# Patient Record
Sex: Female | Born: 1970 | Race: Black or African American | Hispanic: Yes | Marital: Married | Smoking: Never smoker
Health system: Northeastern US, Academic
[De-identification: ages and names within clinical notes are randomized; demographics above are authoritative.]

## PROBLEM LIST (undated history)

## (undated) ENCOUNTER — Encounter

## (undated) MED FILL — IBUPROFEN 400 MG TABLET: 400 400 mg | ORAL | 10 days supply | Qty: 40 | Fill #0 | Status: CN

---

## 2012-06-29 ENCOUNTER — Emergency Department: Admit: 2012-06-29 | Disposition: A | Discharge status: Home / Self Care | Payer: Commercial Managed Care - PPO

## 2012-06-29 DIAGNOSIS — 611  : Principal | ICD-9-CM

## 2012-07-01 ENCOUNTER — Emergency Department: Admit: 2012-07-01 | Disposition: A | Discharge status: Home / Self Care | Payer: Commercial Managed Care - PPO

## 2012-07-01 DIAGNOSIS — 611  : Principal | ICD-9-CM

## 2015-06-29 ENCOUNTER — Ambulatory Visit

## 2015-06-29 NOTE — Progress Notes (Signed)
[facility] June 29, 2015  [address]  [place] [address]  Main: [phone]  Fax:   Patient Portal: [email or web address]            [name]  [address]  [place], [place]  [address]      Dear  [name],     I am writing this letter to inform you that Dr. Jodi Mourning will be shifting away from primary care at [facility].  Please accept our apologies for any inconvenience this may pose.  We will work diligently to ensure that your primary care at [facility] transitions smoothly to one of our many excellent physicians.   In order to ensure your health care needs are met without disruption, we have selected [New MD] to serve as your new Primary Care Physician. If you prefer, we would be happy to discuss other options for a different primary care doctor.   Please contact our office at [phone] to arrange an appointment to meet your new physician. [facility] has extended office hours and is open until 8 pm on Monday-Thursday and 8am-12pm on Saturdays.    If you have any type of HMO insurance, please be sure to let them know that you have a new primary care physician so that there will be no disruption in your coverage.   Please note that this letter was originally created on April 15th. If you have called our office and chosen another Primary Care Physician since this time, please discard this letter.   If you have signed up for our Primary Care portal, please select your new provider on the drop-down list on our website: [email or web address].   Thank you for continuing your care at [facility]. Please feel free to contact me if you have any questions or concerns.  We look forward to providing you with the best primary care.         Sincerely,       Leron Croak, MD, MPH  Chief, Internal Medicine and Adult Primary Care  [facility]           Created By Ann Held  on 06/29/2015 at 02:04 PM    Electronically Signed By Ann Held on 06/29/2015 at 02:04 PM

## 2015-11-15 ENCOUNTER — Ambulatory Visit (HOSPITAL_BASED_OUTPATIENT_CLINIC_OR_DEPARTMENT_OTHER)

## 2015-11-15 ENCOUNTER — Ambulatory Visit

## 2015-11-15 ENCOUNTER — Ambulatory Visit: Admitting: Ophthalmology

## 2015-11-29 ENCOUNTER — Ambulatory Visit

## 2015-11-29 LAB — HX CHOLESTEROL
HX CHOLESTEROL: 167 mg/dL (ref 110–199)
HX HIGH DENSITY LIPOPROTEIN CHOL (HDL): 50 mg/dL (ref 35–75)
HX LDL: 106 mg/dL (ref 0–129)
HX TRIGLYCERIDES: 55 mg/dL (ref 40–250)

## 2015-11-29 LAB — HX CHEM-LIPIDS
HX CHOL-HDL RATIO: 3.3
HX CHOLESTEROL: 167 mg/dL (ref 110–199)
HX HIGH DENSITY LIPOPROTEIN CHOL (HDL): 50 mg/dL (ref 35–75)
HX HOURS FAST: 8 h
HX LDL: 106 mg/dL (ref 0–129)
HX TRIGLYCERIDES: 55 mg/dL (ref 40–250)

## 2015-11-29 LAB — HX CHEM-METABOLIC: HX HEMOGLOBIN A1C: 5.4 % (ref 4.3–5.8)

## 2015-11-29 LAB — HX DIABETES: HX HEMOGLOBIN A1C: 5.4 % (ref 4.3–5.8)

## 2015-11-29 NOTE — Progress Notes (Signed)
General Medicine Visit  .  Service Due by Standard Protocol Rules:   .  Initial Screening   * [place]: Draganitchki (Zahary)  Ht: 63.25 in.  Wt: 186.6 lbs.   BMI: 32.91  with shoes  Temp: 98.3 deg F.     BP (Initial [place] Screening): 126 / 82      BP (Rechecked, Actionable): 126 /   82 mmHg   HR: 65     Travel outside of the [place] in past 28 days:: No  Chronic Pain Assessment: Does pt experience chronic pain ? NO  Smoking Assessment: Tobacco use? never smoker  .  Self Management materials provided? Printed handout: Keeping You Healthy  .  Med List: PRINTED by [place] for patient   Med List: PRINTED by [place] for patient   Patient consents for eRx History:  Paper  .  Falls Risk Assessment:   In the past year, have you ... Had no falls  Difficulty with balance? NO  Need assistance with ambulation while here? NO  Behavioral Health Tools:   PHQ2 Screening Score 0  .  Comments: ......................................Marland KitchenZahary Draganitchki  Septe  mber 19, 2017 10:56 AM  .  .  .  Basic Visit  Chief Complaint:   knee and chest pain  .  History of Present Illness:   45 yo woman here with knee pain/swelling and chest pain  was a patient of Dr Princess Perna, accompanied by her daughter  .  knee pain comes and goes, has been going on for two years  points to back of knee where pain is   sometimes feels like knee is folding and gives out on her  ONLY left knee  going down stairs hurts more than going up. walking for a long distance hur  ts as well  .  chest pain: not every day, but feels here and there, has been going on for   about a month   points to over left breast, sometimes travels over back   gets better when she sits down   usually lasts about 30 min in total  doing anything or bending down tends to make it worse  no other symptoms, no trouble breathing, lighteadedness or dizziness  .  .  .  .  .  .  .  .  .  .  Marland Kitchen  Past Medical History (prior to today's visit):  BENIGN POSITIONAL VERTIGO (ICD-386.11) (ICD10-H81.10)  ALLERGIC CONJUNCTIVITIS  (ICD-372.14) (ICD10-H10.10)  ADULT HEALTHY PHYSICAL (ICD-V70.0) (ICD10-Z00.00)  ELEVATED BLOOD PRESSURE WITHOUT DIAGNOSIS OF HYPERTENSION (ICD-796.2) (ICD1  0-R03.0)  VARICOSE VEINS (ICD-454.9) (ICD10-I83.90)  OTHER SCREENING MAMMOGRAM (ICD-V76.12) (ICD10-Z12.39)  KNEE PAIN, LEFT, ACUTE (ICD-719.46) (ICD10-M25.562)  NEVI, MULTIPLE (ICD-216.9) (ICD10-D23.9)  ROUTINE GYNECOLOGICAL EXAMINATION (ICD-V72.31) (ICD10-Z01.419)  ROUTINE GENERAL MEDICAL EXAM@HEALTH  CARE FACL (ICD-V70.0) (ICD10-Z00.00)  VITAMIN D DEFICIENCY (ICD-268.9) (ICD10-E55.9)  PTERYGIUM (ICD-372.40) (ICD10-H11.009)  SCREENING - LIPID (ICD-V77.91) (ICD10-Z13.220)  Hx of HERNIA, UMBILICAL (ICD-553.1) (ZOX09-U04.9)  Hx of OVARIAN CYST (ICD-620.2) (ICD10-N83.20)  Hx of PLANTAR WART, RIGHT (ICD-078.12) (ICD10-B07.0)  Hx of WART, LEFT HAND (ICD-078.10) (ICD10-B07.8)  ABN PAP ASCUS PAP (ICD-795.01) (ICD10-R87.610)  .  Past Medical History (changes today):  Added new problem of KNEE PAIN, LEFT (ICD-719.46) (ICD10-M25.562)  Added new problem of CHEST DISCOMFORT, ATYPICAL (ICD-786.59) (ICD10-R07.89)  Removed problem of ADULT HEALTHY PHYSICAL (ICD-V70.0) (ICD10-Z00.00)  Removed problem of ELEVATED BLOOD PRESSURE WITHOUT DIAGNOSIS OF HYPERTENSIO  N (ICD-796.2) (ICD10-R03.0)  Removed problem of OTHER SCREENING MAMMOGRAM (ICD-V76.12) (ICD10-Z12.39)  Removed problem of KNEE PAIN, LEFT, ACUTE (  BJY-782.95) (ICD10-M25.562)  Removed problem of ROUTINE GYNECOLOGICAL EXAMINATION (ICD-V72.31) (ICD10-Z0  1.419)  Removed problem of ROUTINE GENERAL MEDICAL EXAM@HEALTH  CARE FACL (ICD-V70.0  ) (ICD10-Z00.00)  Removed problem of SCREENING - LIPID (ICD-V77.91) (ICD10-Z13.220)  Removed problem of History of  WART, LEFT HAND (ICD-078.10) (ICD10-B07.8)  Removed problem of History of  PLANTAR WART, RIGHT (ICD-078.12) (ICD10-B07.  0)  .  Medications (prior to today's visit):  ALAWAY 0.025 % SOLN (KETOTIFEN FUMARATE) use twoce ad ay on bot h eyes for   1 month  ERGOCALCIFEROL 50000 UNIT  CAPS (ERGOCALCIFEROL) 1 tablet weekly  MECLIZINE HCL 25 MG CHEW (MECLIZINE HCL) 1 tablet po tid for 1 week  .  Medications (after today's visit):  ALAWAY 0.025 % SOLN (KETOTIFEN FUMARATE) use twoce ad ay on bot h eyes for   1 month  .  Marland Kitchen  No Known Allergies  .  .  .  Vitals:   Ht: 63.25 in.  Wt: 186.6 lbs.  BMI (in-lb) 32.91  Temp: 98.3deg F.     BP (Initial [place] Screening): 126 / 82     BP (Rechecked, Actionable): 126 / 8  2 mmHg   Pulse Rate: 65 bpm  .  .  Additional PE:   gen: no acute distress   cv: rrr. chest pain is reproducible with palpation over left edge of sternum  pulm: resp effort wnl, lungs ctab   msk: no erythema or swelling over either knee. no pain with varus or valgus   stress   neuro: a /T/ o x 4  .  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  Chest wall discomfort: suspect costochondritis. Check EKG today. Try tyleno  l or ibuprofen as needed, Warm or cold packs as needed. Let me know if wors  ening before I see you in October instead of getting better  .  Check labs today in preparation for annual exam    .  .  Orders (this visit):  [facility] [Ref-PT]  EKG - Heart Station [CPT-93000]  Hemoglobin A1C (Glycohemoglobin) [CPT-83036]  Lipid Profile-Chol, HDL, Trig, LDL(calc) [CPT-80061]  Vit D 25 Hydroxy [CPT-82652]  Est Level 4 (1121/NP2292) [AOZ-30865]  .  Patient Care Plan  .  .  .  .  Vaccine Info Sheets   .  Marland Kitchen  Electronically Signed by Ailene Ards, MD on 12/05/2015 at 2:48 PM  ________________________________________________________________________

## 2015-11-29 NOTE — Progress Notes (Signed)
[facility] November 29, 2015  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 Check EKG today. Try tylenol or ibuprofen as needed, Warm or cold packs as needed. Let me know if worsening before I see you in October instead of getting better    Check labs today in preparation for annual exam     Labs, Tests and Referrals ordered:  Hemoglobin A1C (Glycohemoglobin) [CPT-83036]  Lipid Profile-Chol, HDL, Trig, LDL(calc) [CPT-80061]  Vit D 25 Hydroxy [CPT-82652]  [facility] [Ref-PT]  Medications added or changed today include:      Medications removed today include:  ERGOCALCIFEROL 50000 UNIT CAPS (ERGOCALCIFEROL) 1 tablet weekly  MECLIZINE HCL 25 MG CHEW (MECLIZINE HCL) 1 tablet po tid for 1 week    For a complete listing of all of your current medications, please see the last page of this Clinical Visit Summary.    Problems associated with your visit include:  Added new problem of KNEE PAIN, LEFT (ICD-719.46) (ICD10-M25.562)  Added new problem of CHEST DISCOMFORT, ATYPICAL (ICD-786.59) (ICD10-R07.89)    Problem list on 11/29/2015:  CHEST DISCOMFORT, ATYPICAL (ICD-786.59) (ICD10-R07.89)  KNEE PAIN, LEFT (ICD-719.46) (ICD10-M25.562)  BENIGN POSITIONAL VERTIGO (ICD-386.11) (ICD10-H81.10)  ALLERGIC CONJUNCTIVITIS (ICD-372.14) (ICD10-H10.10)  ADULT HEALTHY  PHYSICAL (ICD-V70.0) (ICD10-Z00.00)  ELEVATED BLOOD PRESSURE WITHOUT DIAGNOSIS OF HYPERTENSION (ICD-796.2) (ICD10-R03.0)  VARICOSE VEINS (ICD-454.9) (ICD10-I83.90)  OTHER SCREENING MAMMOGRAM (ICD-V76.12) (ICD10-Z12.39)  KNEE PAIN, LEFT, ACUTE (ICD-719.46) (ICD10-M25.562)  NEVI, MULTIPLE (ICD-216.9) (ICD10-D23.9)  ROUTINE GYNECOLOGICAL EXAMINATION (ICD-V72.31) (ICD10-Z01.419)  ROUTINE GENERAL MEDICAL EXAM@HEALTH  CARE FACL (ICD-V70.0) (ICD10-Z00.00)  VITAMIN D DEFICIENCY (ICD-268.9) (ICD10-E55.9)  PTERYGIUM (ICD-372.40) (ICD10-H11.009)  SCREENING - LIPID (ICD-V77.91) (ICD10-Z13.220)  Hx of HERNIA, UMBILICAL (ICD-553.1) (FIE33-I95.9)  Hx of OVARIAN CYST (ICD-620.2) (ICD10-N83.20)  Hx of PLANTAR WART, RIGHT (ICD-078.12) (ICD10-B07.0)  Hx of WART, LEFT HAND (ICD-078.10) (ICD10-B07.8)  ABN PAP ASCUS PAP (ICD-795.01) (ICD10-R87.610)    Most recent vital signs observed:  Height: 63.25 inches   Weight: 186.6 pounds   *BMI: 32.91  Temperature: 98.3 degrees F   Pulse Rate: 65 beats/minute   Blood Pressure: 126 / 82 mmHg     *BMI (Body Mass Index) is a measure of healthy weight based on height.  For more information go to [email or web address]  Categories:   Underweight = <18.5   Normal weight = 18.5-24.9   Overweight = 25-29.9           Obesity = BMI of 30 or greater    Most recent blood tests ordered by your Primary Care team as of 11/29/2015:  Cholesterol Tests   Cholesterol 158 Normal = 110-199 02/15/2014   HDL (good cholesterol) 46 Normal > 40 02/15/2014   Triglyceride 44 Normal = 40-250 02/15/2014   LDL (bad cholesterol) 103 Normal < 160  Normal < 130 if you have  high blood pressure  Normal < 100 if you have heart disease or diabetes 02/15/2014   Diabetes Tests   Glucose random 84 Normal = 60-150 02/15/2014   HgbA1c 5.2 Normal = 4.3-5.8 07/11/2012   Blood Count Tests   Hematocrit 38.7 Normal = 37-47 02/15/2014   White Blood Cells 8.1 Normal = 4-11 02/15/2014   Platelets 231 Normal = 150-400 02/15/2014   Liver  Tests   ALT 11 Normal = 0-54 02/15/2014   AST 15 Normal = 10-42 02/15/2014   Kidney Tests   BUN 13 Normal = 6-24 02/15/2014   Creatinine 0.74 Normal = 0.57-1.30 02/15/2014   GFR (Non African-American) 100 Normal > 60 02/15/2014   GFR (African-American) 116 Normal > 60 02/15/2014     General Medical Advice:  If you think any of the information provided in this summary is incorrect, please notify your provider.    Always carry a list of current medications with you . Provide an updated list to your Primary care Physician or any provider who prescribes your medication.    We have a doctor on call during our off hours--if you have an urgent problem and need to speak with a doctor when our office is closed, please call [phone].    Please help [place] improve the care that we provide.    Complete our Patient Satisfaction Survey at [email or web address]     [facility] Medication List  [facility]  [name]  DOB: [birth year]   MR#: [id number]  Primary Care Doctor: Ailene Ards, MD  [phone]    Medications on 11/29/2015:  ALAWAY 0.025 % SOLN (KETOTIFEN FUMARATE) use twoce ad ay on bot h eyes for 1 month    Allergies on 11/29/2015: (if it says "NKA" below, that means "No known allergies")  NKA    Advice about Medications:  Always carry a list of current medications with you . Provide an updated list to your Primary care Physician or any provider who prescribes your medication.    Remember to keep your list updated. Include all over the counter medications such as vitamins and herbals.    Discard all old medication lists.    If you are a specialist seeing this patient and you have made changes to any of the medications above,   please mark the changes on this medication list.      Created By Ailene Ards MD on 11/29/2015 at 11:22 AM    Electronically Signed By Ailene Ards MD on 11/29/2015 at 11:22 AM

## 2015-11-29 NOTE — Progress Notes (Signed)
[facility] November 29, 2015  [address]   [place]  [place] [address]  Main: [phone]  Fax: [phone]  Patient Portal: [email or web address]              [name]  [address]  [place], [place]  [address]          MR#: [id number]          DOB: 12/24/1970    To Whom It May Concern:    I am referring [name] for PHYSICAL THERAPY:    Medical problems include:   Osteoarthritis    Diagnosis:   Knee pain:  left, patellofemoral syndrome, mild OA     Frequency: as needed     Precautions: none     Please evaluate and treat as indicated.      Sincerely,      Ailene Ards, MD  [facility]  [phone]      Created By Ailene Ards MD on 11/29/2015 at 11:19 AM    Electronically Signed By Ailene Ards MD on 11/29/2015 at 11:20 AM

## 2015-11-30 ENCOUNTER — Ambulatory Visit

## 2015-11-30 NOTE — Progress Notes (Signed)
[facility] November 30, 2015  [address]  [place] [address]  Main: [phone]  Fax:   Patient Portal: [email or web address]              Edilia Risdon  [address]  [place], [place]  [address]          MR#: [id number]          DOB: May 20, 1970    Dear  Ms. Berber,    The results of the tests performed during your visit are as follows:  Cholesterol Tests: The cholesterol looks fine and your overall cardiac risk is low.   Cholesterol 167 Normal = 110-199 11/29/2015   HDL (good cholesterol) 50 Normal > 40 11/29/2015   Triglyceride 55 Normal = 40-250 11/29/2015   LDL (bad cholesterol) 106 Normal < 190  (If you are on a cholesterol medicine, the goal is to lower this by at least 50% from your baseline) 11/29/2015   Your risk of heart attack or stroke in the next 10 years is 0.6 % If > 7.5%, then consider starting a type of medicine called "statin"     Calculate your own risk here:  http://tools.BuyDucts.dk   For more information, visit   http://tools.TransportationAnalyst.gl    Diabetes Tests: good   HgbA1c 5.4 Normal = 4.3-5.8 11/29/2015       Other Tests: The vitamin D is still low. Are you taking the once weekly dose of vitamin D? If you are not, please start taking an over the counter vitamin D supplement, 1000 units once daily.    Vitamin D 14 Normal = 20-100 11/29/2015     It was nice to meet you. Please let me know if you have any questions,     Sincerely,       Ailene Ards, MD  [facility]      ** Please be aware of our new extended hours to better care for you. Hours are: Monday through Thursday from 8 am to 8 pm; Friday from 8 am to 5 pm; Saturday from 8 am to 12 noon.        Created By Sharyn Dross on 11/30/2015 at 01:43 PM    Electronically Signed By Ailene Ards MD on 12/01/2015 at 12:55 PM

## 2015-12-01 LAB — HX CHEM-VITAMIN
HX VITAMIN D 25OH-D2: <2 ng/mL
HX VITAMIN D 25OH-D3: 14 ng/mL
HX VITAMIN D, 25 HYDROXY: 14 ng/mL — ABNORMAL LOW (ref 20–100)

## 2015-12-01 LAB — HX OSTEOPOROSIS
HX VITAMIN D 25OH-D2: <2 ng/mL
HX VITAMIN D 25OH-D3: 14 ng/mL

## 2015-12-05 ENCOUNTER — Ambulatory Visit

## 2016-01-07 ENCOUNTER — Ambulatory Visit

## 2016-01-07 NOTE — Progress Notes (Signed)
General Medicine Visit    Service Due by Standard Protocol Rules:     Initial Screening   * [place]: [name]  Ht: 63.25 in.  Wt: 190.4 lbs.   BMI: 33.58  with shoes  Temp: 97.1 deg F.     BP (InitialScreening): 112 / 65      BP (Rechecked, Actionable): 112 / 65 mmHg   HR: 66     Travel outside of the [place] in past 28 days:: No  Chronic Pain Assessment: Does pt experience chronic pain ? NO  Have you received a Flu shot recently (where)? workplace  Approximate Date: 12/16/2015  Smoking Assessment: Tobacco use? never smoker        Patient consents for eRx History:  Paper    Falls Risk Assessment:   In the past year, have you ... Had no falls  Difficulty with balance? NO  Need assistance with ambulation while here? NO  Behavioral Health Tools:   PHQ2 Screening Score 0    Comments: ......................................Marland KitchenRimsha [name]  January 07, 2016 10:33 AM       Basic Visit  Chief Complaint:   annual     History of Present Illness:   45 yo woman here for annual   CHEST DISCOMFORT, ATYPICAL: no further episodes  KNEE PAIN, LEFT: started physical therapy, pain has not changed. tends to come on with walking. ok in the morning. back tends to get swollen never gives out on her  BENIGN POSITIONAL VERTIGO: denies symptoms of this  ALLERGIC CONJUNCTIVITIS: uses eydrops prn  VARICOSE VEINS: using compression stockings (saw vascular surg Aug 2016)   VITAMIN D DEFICIENCY: taking vitamin D daily   PTERYGIUM: sees eye doctor  Hx of HERNIA, UMBILICAL  Hx of OVARIAN CYST        Past Medical History:(reviewed)  KNEE PAIN, LEFT  VITAMIN D DEFICIENCY  ALLERGIC CONJUNCTIVITIS  PTERYGIUM  Hx of HERNIA, UMBILICAL  Hx of OVARIAN CYST    HCM:  Tdap: 2011  Zoster: at age 58  Prevnar/Pneumovax: at age 53  HAV/HBV status:  Pap: Oct 2010: NIL/HPV-, May 2014: NIL/HPV-,   Mammo: May 2016: wnl, dense breasts  Colonoscopy: at age 35  DEXA: at age 58    Family History: (reviewed)   M: healthy  F: healthy  Siblings: 8, healthy, one with surgery  for valvulopathy  Children: 3, healthy   no hx of breast, colon ca in family, no hx heart disease or strokes    Social History: (reviewed)   Works: Advertising copywriter, full time  Tobacco: never   EtOH: none   IVDU: none  Exercise: walks   Diet: balanced, about 2 servings calcium/day   Seatbelts/sunscreen: Y   Feels safe at home: Y, lives with family  Sexually active: Y  Contraception: vasectomy      A complete ROS was done. All positive responses are listed in HPI section. All other systems reviewed in detail and are negative.      Past Medical History (prior to today's visit):  CHEST DISCOMFORT, ATYPICAL (ICD-786.59) (ICD10-R07.89)  KNEE PAIN, LEFT (ICD-719.46) (ICD10-M25.562)  BENIGN POSITIONAL VERTIGO (ICD-386.11) (ICD10-H81.10)  ALLERGIC CONJUNCTIVITIS (ICD-372.14) (ICD10-H10.10)  VARICOSE VEINS (ICD-454.9) (ICD10-I83.90)  NEVI, MULTIPLE (ICD-216.9) (ICD10-D23.9)  VITAMIN D DEFICIENCY (ICD-268.9) (ICD10-E55.9)  PTERYGIUM (ICD-372.40) (ICD10-H11.009)  Hx of HERNIA, UMBILICAL (ICD-553.1) (UEA54-U98.9)  Hx of OVARIAN CYST (ICD-620.2) (ICD10-N83.20)  ABN PAP ASCUS PAP (ICD-795.01) (ICD10-R87.610)    Past Medical History (changes today):  Removed problem of ABN PAP ASCUS PAP (ICD-795.01) (  ZOX09-U04.540)  Changed problem from CHEST DISCOMFORT, ATYPICAL (ICD-786.59) (ICD10-R07.89) to CHEST DISCOMFORT, ATYPICAL (ICD-786.59) (ICD10-R07.89)  Removed problem of BENIGN POSITIONAL VERTIGO (ICD-386.11) (ICD10-H81.10)  Removed problem of VARICOSE VEINS (ICD-454.9) (ICD10-I83.90)  Added new problem of ANNUAL EXAM - OCT 2017 (ICD-V70.0) (ICD10-Z00.00)  Added new problem of SCREENING MAMMOGRAM (ICD-V76.12) (JWJ19-J47.31)  Removed problem of NEVI, MULTIPLE (ICD-216.9) (ICD10-D23.9)       Medications (prior to today's visit):  ALAWAY 0.025 % SOLN (KETOTIFEN FUMARATE) use twoce ad ay on bot h eyes for 1 month    Medications (after today's visit):  ALAWAY 0.025 % SOLN (KETOTIFEN FUMARATE) use twoce ad ay on bot h eyes for 1 month  VITAMIN  D3 1000 UNIT TABS (CHOLECALCIFEROL) Take one tablet by mouth once daily  * COMPRESSION STOCKING 30-40 MMHG use as directed daily. ICD 10: I83.893, [place] PPO: [phone]         No Known Allergies        Vitals:   Ht: 63.25 in.  Wt: 190.4 lbs.  BMI (in-lb) 33.58  Temp: 97.1deg F.     BP (Initial [place] Screening): 112 / 65     BP (Rechecked, Actionable): 112 / 65 mmHg   Pulse Rate: 66 bpm      Additional PE:   Gen: Alert, NAD, well hydrated, well developed  Head: NCAT  Ears: no external deformities, canals clear, TMs pearly gray, gross hearing intact  Eyes: EOMI, PERRL, no scleral icterus, no conjunctival injection, vision grossly normal. bilateral ptyerigium  Nose: no bleeding  Mouth: Mod dentition, no lesions, tongue midline, no exudate/erythema  Neck: Supple, no ant/post cervical or supraclavicular lymphadenopathy, thyroid not palpable  CV: RRR, no M/R/G, no edema, DP 2+ b/l  Pulm: CTA b/l, resp effort wnl  Abd: soft, nontender, nondistended  Breast: no axillary LAD, breasts symmetric in seated and supine position, no nipple discharge, skin changes or dominant masses   Musc: station and gait wnl, moves all extremities without obvious limitations  Skin: No rashes, no obvious bruising, no suspicious lesions  Neuro: CN 2-12 grossly intact  Psych: Mood and affect appropriate      Assessment & Plan:   45 yo woman here for annual  KNEE PAIN, LEFT: cont PT, refer to ortho  VITAMIN D DEFICIENCY: taking vitamin D   ALLERGIC CONJUNCTIVITIS: eyedrops prn  ANNUAL EXAM:   bp: ok  bmi: overweight  dep scrn: neg   chol/diab scrn fine at last appt  schedule mammo  pap due 2019  flu already done       Orders (this visit):  [place] [facility], Ortho [Ref-Surgery]  Mammo Screening [CPT-77057]  RTC in 12 months [RTC-365]  Est Prev 40-64yo (5784/ON6295) [MWU-13244]    Patient Care Plan      Immunization Worksheet 2016           Created By Deanne Coffer on 01/07/2016 at 10:30 AM    Electronically Signed By Ailene Ards MD on 01/07/2016 at  12:59 PM

## 2016-01-07 NOTE — Progress Notes (Signed)
[facility] January 07, 2016  [address]   [place]  [place] [address]  Main: [phone]  Fax: [phone]  Patient Portal: [email or web address]            [name]  [address]  [place], [place]  [address]  [facility] Clinical Summary   [name]  DOB: 12-24-1970   MR#: [id number]  Primary Care Doctor: Ailene Ards, MD  [phone]    Summary of visit January 07, 2016 with Ailene Ards, MD:   Knee pain: continue physical therapy. Set up appointment with orthopedics    Annual:   blood pressure: good   continue vitamin D once daily  cholesterol and diabetes fine when checked   schedule mammogram  pap: due 2019    Labs, Tests and Referrals ordered:  Mammo Screening [CPT-77057]  [facility], Ortho [Ref-Surgery]  Medications added or changed today include:  VITAMIN D3 1000 UNIT TABS (CHOLECALCIFEROL) Take one tablet by mouth once daily  * COMPRESSION STOCKING 30-40 MMHG use as directed daily. ICD 10: I83.893, [place] PPO: [id number]      Medications removed today include:    For a complete listing of all of your current medications, please see the last page of this Clinical Visit Summary.    Problems associated with your visit include:  Removed problem of ABN PAP ASCUS PAP (ICD-795.01) (ICD10-R87.610)  Changed problem from CHEST DISCOMFORT, ATYPICAL (ICD-786.59) (ICD10-R07.89) to CHEST DISCOMFORT, ATYPICAL (ICD-786.59) (ICD10-R07.89)  Removed problem of BENIGN POSITIONAL VERTIGO (ICD-386.11) (ICD10-H81.10)  Removed problem of ALLERGIC CONJUNCTIVITIS (ICD-372.14) (ICD10-H10.10)  Removed problem of VARICOSE VEINS (ICD-454.9) (ICD10-I83.90)  Added new problem of ANNUAL EXAM - OCT 2017 (ICD-V70.0) (ICD10-Z00.00)  Added new problem of SCREENING MAMMOGRAM (ICD-V76.12) (SAY30-Z60.10)    Problem list on 01/07/2016:  SCREENING MAMMOGRAM (ICD-V76.12) (ICD10-Z12.31)  ANNUAL EXAM - OCT 2017 (ICD-V70.0) (ICD10-Z00.00)  CHEST DISCOMFORT, ATYPICAL  (ICD-786.59) (ICD10-R07.89)  KNEE PAIN, LEFT (ICD-719.46) (ICD10-M25.562)  NEVI, MULTIPLE (ICD-216.9) (ICD10-D23.9)  VITAMIN D DEFICIENCY (ICD-268.9) (ICD10-E55.9)  PTERYGIUM (ICD-372.40) (ICD10-H11.009)  Hx of HERNIA, UMBILICAL (ICD-553.1) (XNA35-T73.9)  Hx of OVARIAN CYST (ICD-620.2) (ICD10-N83.20)    Most recent vital signs observed:  Height: 63.25 inches   Weight: 190.4 pounds   *BMI: 33.58  Temperature: 97.1 degrees F   Pulse Rate: 66 beats/minute   Blood Pressure: 112 / 65 mmHg     *BMI (Body Mass Index) is a measure of healthy weight based on height.  For more information go to [email or web address]  Categories:   Underweight = <18.5   Normal weight = 18.5-24.9   Overweight = 25-29.9           Obesity = BMI of 30 or greater    Most recent blood tests ordered by your Primary Care team as of 01/07/2016:  Cholesterol Tests   Cholesterol 167 Normal = 110-199 11/29/2015   HDL (good cholesterol) 50 Normal > 40 11/29/2015   Triglyceride 55 Normal = 40-250 11/29/2015   LDL (bad cholesterol) 106 Normal < 160  Normal < 130 if you have high blood pressure  Normal < 100 if you have heart disease or diabetes 11/29/2015   Diabetes Tests   Glucose random 84 Normal = 60-150 02/15/2014   HgbA1c 5.4 Normal = 4.3-5.8 11/29/2015   Blood Count Tests   Hematocrit 38.7 Normal = 37-47 02/15/2014   White Blood Cells 8.1 Normal = 4-11 02/15/2014   Platelets 231 Normal = 150-400 02/15/2014   Liver Tests   ALT 11 Normal = 0-54 02/15/2014  AST 15 Normal = 10-42 02/15/2014   Kidney Tests   BUN 13 Normal = 6-24 02/15/2014   Creatinine 0.74 Normal = 0.57-1.30 02/15/2014   GFR (Non African-American) 100 Normal > 60 02/15/2014   GFR (African-American) 116 Normal > 60 02/15/2014     General Medical Advice:  If you think any of the information provided in this summary is incorrect, please notify your provider.    Always carry a list of current medications with you . Provide an updated list to your Primary care Physician or any  provider who prescribes your medication.    We have a doctor on call during our off hours--if you have an urgent problem and need to speak with a doctor when our office is closed, please call [phone].    Please help [place] improve the care that we provide.    Complete our Patient Satisfaction Survey at [email or web address]     [facility] Medication List  [facility]  [name]  DOB: [birth year]   MR#: [id number]  Primary Care Doctor: Ailene Ards, MD  [phone]    Medications on 01/07/2016:  ALAWAY 0.025 % SOLN (KETOTIFEN FUMARATE) use twoce ad ay on bot h eyes for 1 month  VITAMIN D3 1000 UNIT TABS (CHOLECALCIFEROL) Take one tablet by mouth once daily  * COMPRESSION STOCKING 30-40 MMHG use as directed daily. ICD 10: I83.893, [place] PPO: [id number]    Allergies on 01/07/2016: (if it says "NKA" below, that means "No known allergies")  NKA    Advice about Medications:  Always carry a list of current medications with you . Provide an updated list to your Primary care Physician or any provider who prescribes your medication.    Remember to keep your list updated. Include all over the counter medications such as vitamins and herbals.    Discard all old medication lists.    If you are a specialist seeing this patient and you have made changes to any of the medications above,   please mark the changes on this medication list.      Created By Ailene Ards MD on 01/07/2016 at 11:20 AM    Electronically Signed By Ailene Ards MD on 01/07/2016 at 11:20 AM

## 2016-01-07 NOTE — Progress Notes (Signed)
Checkout  Return to Clinic 12 months  Next Visit Notes: pe  Appointment Made No  Reason Appointment Not Made Patient unable to schedule at this time          Created By Mertha Finders on 01/07/2016 at 11:27 AM    Electronically Signed By Mertha Finders on 01/07/2016 at 11:28 AM

## 2016-01-07 NOTE — Progress Notes (Signed)
General Medicine Visit  .  Service Due by Standard Protocol Rules:   .  Initial Screening   * [place]: [name]  Ht: 63.25 in.  Wt: 190.4 lbs.   BMI: 33.58  with shoes  Temp: 97.1 deg F.     BP (Initial [place] Screening): 112 / 65      BP (Rechecked, Actionable): 112 /   65 mmHg   HR: 66     Travel outside of the [place] in past 28 days:: No  Chronic Pain Assessment: Does pt experience chronic pain ? NO  Have you received a Flu shot recently (where)? workplace  Approximate Date: 12/16/2015  Smoking Assessment: Tobacco use? never smoker  .  .  .  Patient consents for eRx History:  Paper  .  Falls Risk Assessment:   In the past year, have you ... Had no falls  Difficulty with balance? NO  Need assistance with ambulation while here? NO  Behavioral Health Tools:   PHQ2 Screening Score 0  .  Comments: ......................................[name] KitchenRimsha [name]  January 07, 2016 10:33 AM  .  Basic Visit  Chief Complaint:   annual   .  History of Present Illness:   45 yo woman here for annual   CHEST DISCOMFORT, ATYPICAL: no further episodes  KNEE PAIN, LEFT: started physical therapy, pain has not changed. tends to c  ome on with walking. ok in the morning. back tends to get swollen never giv  es out on her  BENIGN POSITIONAL VERTIGO: denies symptoms of this  ALLERGIC CONJUNCTIVITIS: uses eydrops prn  VARICOSE VEINS: using compression stockings (saw vascular surg Aug 2016)   VITAMIN D DEFICIENCY: taking vitamin D daily   PTERYGIUM: sees eye doctor  Hx of HERNIA, UMBILICAL  Hx of OVARIAN CYST  .  [name] [name]  [name] [name]  Past Medical History:(reviewed)  KNEE PAIN, LEFT  VITAMIN D DEFICIENCY  ALLERGIC CONJUNCTIVITIS  PTERYGIUM  Hx of HERNIA, UMBILICAL  Hx of OVARIAN CYST  .  HCM:  Tdap: 2011  Zoster: at age 38  Prevnar/Pneumovax: at age 47  HAV/HBV status:  Pap: Oct 2010: NIL/HPV-, May 2014: NIL/HPV-,   Mammo: May 2016: wnl, dense breasts  Colonoscopy: at age 69  DEXA: at age 7  .  Family History: (reviewed)   M: healthy  F: healthy  Siblings: 8,  healthy, one with surgery for valvulopathy  Children: 3, healthy   no hx of breast, colon ca in family, no hx heart disease or strokes  .  Social History: (reviewed)   Works: Advertising copywriter, full time  Tobacco: never   EtOH: none   IVDU: none  Exercise: walks   Diet: balanced, about 2 servings calcium/day   Seatbelts/sunscreen: Y   Feels safe at home: Y, lives with family  Sexually active: Y  Contraception: vasectomy  .  [name] [name]  A complete ROS was done. All positive responses are listed in HPI section.   All other systems reviewed in detail and are negative.  .  .  Past Medical History (prior to today's visit):  CHEST DISCOMFORT, ATYPICAL (ICD-786.59) (ICD10-R07.89)  KNEE PAIN, LEFT (ICD-719.46) (ICD10-M25.562)  BENIGN POSITIONAL VERTIGO (ICD-386.11) (ICD10-H81.10)  ALLERGIC CONJUNCTIVITIS (ICD-372.14) (ICD10-H10.10)  VARICOSE VEINS (ICD-454.9) (ICD10-I83.90)  NEVI, MULTIPLE (ICD-216.9) (ICD10-D23.9)  VITAMIN D DEFICIENCY (ICD-268.9) (ICD10-E55.9)  PTERYGIUM (ICD-372.40) (ICD10-H11.009)  Hx of HERNIA, UMBILICAL (ICD-553.1) (BJY78-G95.9)  Hx of OVARIAN CYST (ICD-620.2) (ICD10-N83.20)  ABN PAP ASCUS PAP (ICD-795.01) (ICD10-R87.610)  .  Past Medical History (changes today):  Removed  problem of ABN PAP ASCUS PAP (ICD-795.01) (ICD10-R87.610)  Changed problem from CHEST DISCOMFORT, ATYPICAL (ICD-786.59) (ICD10-R07.89)   to CHEST DISCOMFORT, ATYPICAL (ICD-786.59) (ICD10-R07.89)  Removed problem of BENIGN POSITIONAL VERTIGO (ICD-386.11) (ICD10-H81.10)  Removed problem of VARICOSE VEINS (ICD-454.9) (ICD10-I83.90)  Added new problem of ANNUAL EXAM - OCT 2017 (ICD-V70.0) (ICD10-Z00.00)  Added new problem of SCREENING MAMMOGRAM (ICD-V76.12) (HKV42-V95.31)  Removed problem of NEVI, MULTIPLE (ICD-216.9) (ICD10-D23.9)  .  Medications (prior to today's visit):  ALAWAY 0.025 % SOLN (KETOTIFEN FUMARATE) use twoce ad ay on bot h eyes for   1 month  .  Medications (after today's visit):  ALAWAY 0.025 % SOLN (KETOTIFEN FUMARATE) use twoce ad ay  on bot h eyes for   1 month  VITAMIN D3 1000 UNIT TABS (CHOLECALCIFEROL) Take one tablet by mouth once d  aily  * COMPRESSION STOCKING 30-40 MMHG use as directed daily. ICD 10: I83.893, [place]  [employer] PPO: [id number]  .  [name] [name]  No Known Allergies  .  .  .  Vitals:   Ht: 63.25 in.  Wt: 190.4 lbs.  BMI (in-lb) 33.58  Temp: 97.1deg F.     BP (Initial [place] Screening): 112 / 65     BP (Rechecked, Actionable): 112 / 6  5 mmHg   Pulse Rate: 66 bpm  .  .  Additional PE:   Gen: Alert, NAD, well hydrated, well developed  Head: NCAT  Ears: no external deformities, canals clear, TMs pearly gray, gross hearing   intact  Eyes: EOMI, PERRL, no scleral icterus, no conjunctival injection, vision gr  ossly normal. bilateral ptyerigium  Nose: no bleeding  Mouth: Mod dentition, no lesions, tongue midline, no exudate/erythema  Neck: Supple, no ant/post cervical or supraclavicular lymphadenopathy, thyr  oid not palpable  CV: RRR, no M/R/G, no edema, DP 2+ b/l  Pulm: CTA b/l, resp effort wnl  Abd: soft, nontender, nondistended  Breast: no axillary LAD, breasts symmetric in seated and supine position, n  o nipple discharge, skin changes or dominant masses   Musc: station and gait wnl, moves all extremities without obvious limitatio  ns  Skin: No rashes, no obvious bruising, no suspicious lesions  Neuro: CN 2-12 grossly intact  Psych: Mood and affect appropriate  .  [name] [name]  Assessment /T/ Plan:   45 yo woman here for annual  KNEE PAIN, LEFT: cont PT, refer to ortho  VITAMIN D DEFICIENCY: taking vitamin D   ALLERGIC CONJUNCTIVITIS: eyedrops prn  ANNUAL EXAM:   bp: ok  bmi: overweight  dep scrn: neg   chol/diab scrn fine at last appt  schedule mammo  pap due 2019  flu already done   .  [name] [name]  Orders (this visit):  [facility], Ortho [Ref-Surgery]  Mammo Screening [CPT-77057]  RTC in 12 months [RTC-365]  Est Prev 40-64yo ([id number]) [CPT-99396]  .  Patient Care Plan  .  [name] [name]  Immunization Worksheet 2016   .  .  [name] [name]  Electronically Signed by Ailene Ards, MD  on 01/07/2016 at 12:59 PM  ________________________________________________________________________

## 2016-01-26 ENCOUNTER — Ambulatory Visit

## 2016-01-26 ENCOUNTER — Ambulatory Visit: Admitting: Orthopaedic Surgery

## 2016-01-26 NOTE — Progress Notes (Signed)
* * *        **[name], [name]**    --- ---    45 Y old Female, DOB: 02/14/1971, External MRN: [id number]    Account Number: [id number]    [address], [place], [place]    Home: [phone]    Guarantor: [employer] Insurance: [employer] PPO Payer ID: PAPER    PCP: Grayling Congress, MD Referring: Grayling Congress, MD External  Visit ID: [id number]    [facility]ointment [facility]        * * *    01/26/2016  Progress Notes: Baron Hamper, MD **CHN#:** [id number]    --- ---    ---        Current Medications    ---    Taking     * Vitamin D     ---    Discontinued    * Compression Stockings 30-40 mmHg Thigh High - as directed ICD10: I83.893 [place] PPO: [id number] daily    ---    * Multi For Her Tablet Orally     ---    * Medication List reviewed and reconciled with the patient    ---      Past Medical History    ---       Varicose veins.        ---      Surgical History    ---      Umbilical Hernia repair    ---      Family History    ---      Mother: alive    ---    Father: alive    ---      Social History    ---    Tobacco history: Never smoked.    Alcohol  Denies.      Allergies    ---      N.K.D.A.    ---    Forrestine Him Verified]      Hospitalization/Major Diagnostic Procedure    ---      No Hospitalization History.    ---      Review of Systems    ---     _ORT_ :    Eyes No. Ear, Nose Throat No. Digestion, Stomach, Bowel No. Bladder Problems  No. Bleeding Problems No. Numbness/Tingling No. Anxiety/Depression No.  Fever/Chills/Fatigue No. Chest Pain/Tightness/Palpitations No. Skin Rash No.  Dental Problems No. Joint/Muscle Pain/Cramps Yes. Blackout/Fainting No. Other  No.            Reason for Appointment    ---      1\. LEFT KNEE PAIN    ---      History of Present Illness    ---     _GENERAL_ :    45 year old cape verdian speaking woman presents with her adult daughter  acting as an interpreter as a new patient with complaints of long standing  left knee pain. She  describes 5 years of insidious onset, waxing/waning left  knee pain. Localizes to anteriolateral and posteromedial knee however is deep.  Is worsened with activity incl stairs, walking, standing for long periods of  time, she is now unable to kneel due to the knee pain. She states it is  improved with laying down. Denies hx of trauma. Occasionally has left  hip/groin pain but not her primary complaint. + mechanical symptoms with  buckling, catching and locking described. No numbness/tingling, no fevers  chills.      Vital  Signs    ---    Pain scale 1, Ht-in 5 ft 6 in.      Physical Examination    ---    General: WD, WN obese. In NAD.    Psych: cooperative.    Neurologic: able to respond to stimuli and questions.    Eyes: sclera anicteric.    Head/Neck: NCAT.    Pulmonary: breathing comfortably. Cardiovascular: limbs all warm and well  perfused. Skin: No obvious rash. MSK:    Gait nonantalgic.    Marland Kitchen    Left Knee:    .Inspection: skin intact, no gross deformity, no effusion appreciated however  limited by habitis, no erythema    .Palpation: no boney tenderness, no crepitus, no warmth    .JIR:CVELFY 0- 90 flexion limited by pain; passive 0-120 pain limited    .Menisci: + posteromedial and lateral jointline tenderness, positive  McMurray''s for pain but no clunk, positive deep flexion, + pain with forced  full extension    .Ligaments: firm endpoint however there is increased translation with varus  stress at 0 and 30 degrees, no increased translation with valgus at 0/30; firm  endpoint no excessive translation with anterior/posterior drawer and Lachman  test    .Patella: negative grind, no quad inhibition, no excessive medial or lateral  translation, no apprehension    .Tendons: quad and patellar tendon without gap or tenderness, able to straight  leg raise, no extensor lag    .Marland Kitchen Normal Hip ROM compared to right however + pain with hip flexion IR/ER,  positive stinchfield, however states this is not the pain that causes  her to  come in.    Distal NV:    .Fires EHL,TA,GS,Peroneals    .Light touch intact DP,SP,Saph,Sural and tibial nerve distribution    .Foot/Toes warm and well perfused with brisk cap refill    .    Marland Kitchen    Marland KitchenIMAGING:Radiographs of the left knee were obtained and reviewed today. These  demonstrate no obvious fracture or dislocation. There is no degenerative  change. There is no obvious effusion. There is no gross evidence of  malalignment.      Assessments    ---    1\. Injury of meniscus of left knee, initial encounter - B01.7P1W (Primary),  probable    ---      Treatment    ---       **1\. Injury of meniscus of left knee, initial encounter**    Notes: Patient was seen and examined with Dr Everardo Beals today. She has numerous  symptoms consistent with meniscal injury and also has some likely superimposed  left hip pathology. Her hip is not her primary complaint today so we will  defer treatment and workup at this time. Given signs/symptoms suggestive of  intraarticular derangement of the left knee, we recommend obtaining MRI for  further evaluation. We discussed possible surgical intervention pending  results of the scan. We will see the patient back in clinic with the MRI  finidings.    ---      Follow Up    ---    after MRI    Electronically signed by Baron Hamper , MD on 01/27/2016 at 12:00 PM EST    Sign off status: Completed        * * *        [facility]    [address]    [place], [place] [address]    Tel: [phone]    Fax: [phone]              * * *  Patient: [name], [name] DOB: Nov 25, 1970 Progress Note: Baron Hamper, MD  01/26/2016    ---    Note generated by eClinicalWorks EMR/PM Software ([email or web address])

## 2016-01-26 NOTE — Progress Notes (Signed)
.  Progress Notes  .  Patient: [name]  Provider: Baron Hamper  DOB:1971-01-06 Age: 45 Y Sex: Female  Supervising Provider:: Baron Hamper, MD  Date: 01/26/2016  .  PCP: Grayling Congress MD  Date: 01/26/2016  .  --------------------------------------------------------------------------------  .  REASON FOR APPOINTMENT  .  1. LEFT KNEE PAIN  .  HISTORY OF PRESENT ILLNESS  .  GENERAL:   45 year old cape verdian speaking woman presents with her adult  daughter acting as an interpreter as a new patient with  complaints of long standing left knee pain. She describes 5 years  of insidious onset, waxing/waning left knee pain. Localizes to  anteriolateral and posteromedial knee however is deep. Is  worsened with activity incl stairs, walking, standing for long  periods of time, she is now unable to kneel due to the knee pain.  She states it is improved with laying down. Denies hx of trauma.  Occasionally has left hip/groin pain but not her primary  complaint. + mechanical symptoms with buckling, catching and  locking described. No numbness/tingling, no fevers chills.  .  CURRENT MEDICATIONS  .  Taking Vitamin D  Discontinued Compression Stockings 30-40 mmHg Thigh High - as  directed ICD10: I83.893 [place] PPO: [id number] daily  Discontinued Multi For Her Tablet Orally  Medication List reviewed and reconciled with the patient  .  PAST MEDICAL HISTORY  .  Varicose veins  .  ALLERGIES  .  N.K.D.A.  .  SURGICAL HISTORY  .  Umbilical Hernia repair  .  FAMILY HISTORY  .  Mother: alive  Father: alive  .  SOCIAL HISTORY  .  .  Tobaccohistory:Never smoked.  .  Alcohol Denies.  Marland Kitchen  HOSPITALIZATION/MAJOR DIAGNOSTIC PROCEDURE  .  No Hospitalization History.  Marland Kitchen  REVIEW OF SYSTEMS  .  ORT:  .  Eyes    No . Ear, Nose Throat    No . Digestion, Stomach, Bowel     No . Bladder Problems    No . Bleeding Problems    No .  Numbness/Tingling    No . Anxiety/Depression    No .  Fever/Chills/Fatigue    No . Chest  Pain/Tightness/Palpitations     No . Skin Rash    No . Dental Problems    No . Joint/Muscle  Pain/Cramps    Yes . Blackout/Fainting    No . Other    No .  .  VITAL SIGNS  .  Pain scale 1, Ht-in 5 ft 6 in.  Marland Kitchen  PHYSICAL EXAMINATION  .  General: WD, WN obese. In NAD. Psych: cooperative. Neurologic:  able to respond to stimuli and questions. Eyes: sclera anicteric.  Head/Neck: NCAT. Pulmonary: breathing comfortably.  Cardiovascular: limbs all warm and well perfused. Skin: No  obvious rash. VVO:HYWV nonantalgic.Marland KitchenLeft Knee:.Inspection: skin  intact, no gross deformity, no effusion appreciated however  limited by habitis, no erythema .Palpation: no boney tenderness,  no crepitus, no warmth.PXT:GGYIRS 0- 90 flexion limited by pain;  passive 0-120 pain limited.Menisci: + posteromedial and lateral  jointline tenderness, positive McMurray''s for pain but no clunk,  positive deep flexion, + pain with forced full  extension.Ligaments: firm endpoint however there is increased  translation with varus stress at 0 and 30 degrees, no increased  translation with valgus at 0/30; firm endpoint no excessive  translation with anterior/posterior drawer and Lachman  test.Patella: negative grind, no quad inhibition, no excessive  medial or lateral translation, no  apprehension.Tendons: quad and  patellar tendon without gap or tenderness, able to straight leg  raise, no extensor lag.. Normal Hip ROM compared to right however  + pain with hip flexion IR/ER, positive stinchfield, however  states this is not the pain that causes her to come in. Distal  NV:.Fires EHL,TA,GS,Peroneals.Light touch intact DP,SP,Saph,Sural  and tibial nerve distribution.Foot/Toes warm and well perfused  with brisk cap refill .Marland KitchenMarland KitchenIMAGING:Radiographs of the left knee  were obtained and reviewed today. These demonstrate no obvious  fracture or dislocation. There is no degenerative change. There  is no obvious effusion. There is no gross evidence  of  malalignment.  .  ASSESSMENTS  .  Injury of meniscus of left knee, initial encounter - N82.9F6O  (Primary), probable  .  TREATMENT  .  Injury of meniscus of left knee, initial encounter  Notes: Patient was seen and examined with Dr Everardo Beals today. She has  numerous symptoms consistent with meniscal injury and also has  some likely superimposed left hip pathology. Her hip is not her  primary complaint today so we will defer treatment and workup at  this time. Given signs/symptoms suggestive of intraarticular  derangement of the left knee, we recommend obtaining MRI for  further evaluation. We discussed possible surgical intervention  pending results of the scan. We will see the patient back in  clinic with the MRI finidings.  .  FOLLOW UP  .  after MRI  .  Electronically signed by Baron Hamper , MD on  01/27/2016 at 12:00 PM EST  .  CONFIRMATORY SIGN OFF  .  Marland Kitchen  Document electronically signed by Baron Hamper    .

## 2016-02-04 ENCOUNTER — Ambulatory Visit: Admitting: Orthopaedic Surgery

## 2016-02-04 ENCOUNTER — Ambulatory Visit

## 2016-02-09 ENCOUNTER — Ambulatory Visit: Admitting: Orthopaedic Surgery

## 2016-02-09 ENCOUNTER — Ambulatory Visit

## 2016-02-09 NOTE — Progress Notes (Signed)
.    Progress Notes  .  Patient: [name]  Provider: Baron Hamper  DOB:Sep 05, 1970 Age: 45 Y Sex: Female  .  PCP: Phillips Grout MD  Date: 02/09/2016  .  --------------------------------------------------------------------------------  .  REASON FOR APPOINTMENT  .  1. 2W FU MRI RESULTS LEFT KNEE  .  HISTORY OF PRESENT ILLNESS  .  GENERAL:  [name] is a 45 year-old [place] Verdian  speaking female presenting with her daughter acting as an  interpreter for her f/u for her left knee MRI results. She has  done PT without relief of her symptoms. She denies any changes to  her knee pain, trauma, numbness or tingling of her lower  extremity.  .  CURRENT MEDICATIONS  .  Taking Vitamin D  .  ALLERGIES  .  yes[Allergies Verified]  .  EXAMINATION  .  GENERAL:  GeneralWell-appearing, No acute distress.  On examination of the LLE, the skin of the left knee is intact,  without swelling, ecchymosis or deformity. Mild TTP of the  anterior lateral of knee. Neurovascular intact.  .  Radiographs: MRI of the left knee:-ACL  normal-Menisci normal-Arthritis of anterior medial ridge of the  patella knee joint.  .  ASSESSMENTS  .  Injury of meniscus of left knee, initial encounter - Z61.0R6E  (Primary), probable  .  TREATMENT  .  Injury of meniscus of left knee, initial encounter  Notes: The patient was seen and evaluated with Dr Everardo Beals. The MRI  of the left knee was reviewed with the patient. The results of  the MRI and the clinical exam do not make her a surgerical  candidate. A Celestone injection was offered to the patient for  her symptoms. She agreed to the injection. A f/u is scheduled for  6 weeks to re-evaluate her symptoms. All her questions were  answered. .  .  PROCEDURES  .  ORT  Procedure in Office Today    Following discussion of  benefits/risks, under sterile conditions, an inj of left knee  with 2cc celestone and 3cc of 1% lidocane was carried out in the  office today  .  PROCEDURE CODES  .  20610 05:  Aspiration / injection; major joint/bursa  .  FOLLOW UP  .  6 Weeks (Reason: f/u)  .  Electronically signed by Baron Hamper , MD on  10/09/2018 at 10:58 AM EDT  .  Document electronically signed by Baron Hamper    .

## 2016-02-09 NOTE — Progress Notes (Signed)
* * *    [name], [name] **DOB:** 1970/07/14 [phone] yo F) **Acc No.** [id number] **DOS:**  02/09/2016    ---         [name], Zahava**    --- ---    32 Y old Female, DOB: January 29, 1971, External MRN: [id number]    Account Number: [id number]    [address], [place]    Home: [phone]    Guarantor: Jim Desanctis Insurance: [employer] PPO Payer ID: PAPER    PCP: Phillips Grout, MD Referring: Kendal Hymen, MD External Visit  ID: [id number]    [facility]        * * *    02/09/2016  Progress Notes: Baron Hamper, MD **CHN#:** [id number]    --- ---    ---         **Current Medications**    ---    Taking     * Vitamin D     ---        **Reason for Appointment**    ---       1\. 2W FU MRI RESULTS LEFT KNEE    ---       **History of Present Illness**    ---     _GENERAL_ :    [name] is a 45 year-old [place] speaking female presenting with her  daughter acting as an interpreter for her f/u for her left knee MRI results.  She has done PT without relief of her symptoms. She denies any changes to her  knee pain, trauma, numbness or tingling of her lower extremity.       **Examination**    ---     _GENERAL:_    General Well-appearing, No acute distress.    On examination of the LLE, the skin of the left knee is intact, without  swelling, ecchymosis or deformity. Mild TTP of the anterior lateral of knee.  Neurovascular intact.     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           **Assessments**    ---    1\. Injury of meniscus of left knee, initial encounter - S83.8X2A (Primary),  probable    ---       **Treatment**    ---       **1\. Injury of meniscus of left knee, initial encounter**    Notes:  The patient was seen and evaluated with Dr Everardo Beals. The MRI of the left  knee was reviewed with the patient. The results of the MRI and the clinical  exam do not make  her a surgerical candidate. A Celestone injection was offered  to the patient for her symptoms. She agreed to the injection. A f/u is  scheduled for 6 weeks to re-evaluate her symptoms. All her questions were  answered. .    ---      **Procedures**    ---     _ORT_ :    Procedure in Office Today Following discussion of benefits/risks, under  sterile conditions, an inj of left knee with 2cc celestone and 3cc of 1%  lidocane was carried out in the office today.          **Procedure Codes**    ---       20610 05: Aspiration / injection; major joint/bursa    ---       **  Follow Up**    ---    6 Weeks (Reason: f/u)    Electronically signed by Baron Hamper , MD on 10/09/2018 at 10:58 AM EDT    Sign off status: Completed        * * *        [facility]    [address]    [place], [address]    [place], [place] [address]    Tel: [phone]    Fax: [phone]              * * *           Progress Note: Baron Hamper, MD 02/09/2016    ---    Note generated by eClinicalWorks EMR/PM Software ([email or web address])

## 2016-03-15 ENCOUNTER — Ambulatory Visit

## 2016-04-05 ENCOUNTER — Ambulatory Visit: Admit: 2016-04-05 | Payer: Commercial Managed Care - PPO

## 2016-04-05 ENCOUNTER — Ambulatory Visit: Admitting: Orthopaedic Surgery

## 2016-04-05 ENCOUNTER — Ambulatory Visit

## 2016-04-05 DIAGNOSIS — ? MAJ DEPRESS D/O RECURRENT: Secondary | ICD-10-CM

## 2016-04-05 DIAGNOSIS — M1712 Unilateral primary osteoarthritis, left knee: Principal | ICD-10-CM

## 2016-04-05 NOTE — Progress Notes (Signed)
.    Progress Notes  .  Patient: [name], [name]  Provider: Tori Milks    .  DOB: 04-Aug-1970 Age: 46 Y Sex: Female  Supervising Provider:: Baron Hamper, MD  Date: 04/05/2016  .  PCP: Phillips Grout MD  Date: 04/05/2016  .  --------------------------------------------------------------------------------  .  REASON FOR APPOINTMENT  .  1. 6W FU LT KNEE PAIN  .  HISTORY OF PRESENT ILLNESS  .  GENERAL:  [name] is now 6 weeks out from left knee  steroid injection for mild DJD. She reports she is 80% better.  She did a bit of PT but was feeling too good to warrant  returning. No new complaints.  .  CURRENT MEDICATIONS  .  Taking Vitamin D  Medication List reviewed and reconciled with the patient  .  PAST MEDICAL HISTORY  .  Varicose veins  .  ALLERGIES  .  N.K.D.A.  .  SURGICAL HISTORY  .  Umbilical Hernia repair  .  FAMILY HISTORY  .  Mother: alive, diagnosed with Other  Father: alive  .  SOCIAL HISTORY  .  .  Tobaccohistory:Never smoked.  .  Alcohol Denies.  Marland Kitchen  HOSPITALIZATION/MAJOR DIAGNOSTIC PROCEDURE  .  No Hospitalization History.  Marland Kitchen  REVIEW OF SYSTEMS  .  ORT:  .  Eyes    No . Ear, Nose Throat    No . Digestion, Stomach, Bowel     No . Bladder Problems    No . Bleeding Problems    No .  Numbness/Tingling    No . Anxiety/Depression    No .  Fever/Chills/Fatigue    No . Chest Pain/Tightness/Palpitations     No . Skin Rash    No . Dental Problems    No . Joint/Muscle  Pain/Cramps    No . Blackout/Fainting    No . Other    No .  .  Positive for musculoskeletal as reported in HPI.Negative for  fever, chills, fatigue, night sweats, chest pain, chest  tightness, difficulty breathing, heart palpitations, skin rash,  tooth/mouth pain, blackout, fainting, blurry vision, double  vision, stomach pain, heartburn, constipation, diarrhea, dysuria,  hematuria, bleeding problems, anxiety.  Marland Kitchen  VITAL SIGNS  .  Pain scale 7, Ht-in 5 ft 6 in, Ht-cm 167.64.  Marland Kitchen  PHYSICAL EXAMINATION  .  General Well-appearing, No acute  distress. On examination of the  LLE, the skin of the left knee is intact, without swelling,  ecchymosis or deformity. Mild TTP of the anterior lateral of  knee. Neurovascular intact. ROM 0-120.  .  ASSESSMENTS  .  Primary osteoarthritis of left knee - M17.12 (Primary)  .  TREATMENT  .  Primary osteoarthritis of left knee  Notes: Kody is doing very well after her knee injection. No new  for further intervention at this point given her degree of  improvement. She can do PT, ice, tylenol, NSAIDs as needed.  Otherwise, we will see her back as needed for recurrent pain or  new problems.  .  FOLLOW UP  .  prn  .  Marland Kitchen  Appointment Provider: Tori Milks  .  Electronically signed by Baron Hamper , MD on  04/10/2016 at 03:44 PM EST  .  CONFIRMATORY SIGN OFF  .  Marland Kitchen  Document electronically signed by Tori Milks    .

## 2016-04-05 NOTE — Progress Notes (Signed)
* * *      [name], Buena**    ------    57 Y old Female, DOB: Sep 28, 1970, External MRN: [id number]    Account Number: [id number]    [address], [place], [place]    Home: [phone]    Guarantor: Jim Desanctis Insurance: [employer] PPO Payer ID: PAPER    PCP: Phillips Grout, MD Referring: Ailene Ards External Visit ID:  [id number]    [facility]        * * *    04/05/2016  **Appointment Provider:** Tori Milks **CHN#:** [id number]    ------     **Supervising Provider:** Baron Hamper, MD    ---      Current Medications    ---    Taking    * Vitamin D     ---    * Medication List reviewed and reconciled with the patient    ---     Past Medical History    ---      Varicose veins.        ---     Surgical History    ---     Umbilical Hernia repair    ---     Family History    ---     Mother: alive, diagnosed with Other    ---    Father: alive    ---     Social History    ---    Tobacco history: Never smoked.    Alcohol  Denies.     Allergies    ---     N.K.D.A.    ---    Forrestine Him Verified]     Hospitalization/Major Diagnostic Procedure    ---     No Hospitalization History.    ---     Review of Systems    ---     _ORT_ :    Eyes No. Ear, Nose Throat No. Digestion, Stomach, Bowel No. Bladder Problems  No. Bleeding Problems No. Numbness/Tingling No. Anxiety/Depression No.  Fever/Chills/Fatigue No. Chest Pain/Tightness/Palpitations No. Skin Rash No.  Dental Problems No. Joint/Muscle Pain/Cramps No. Blackout/Fainting No. Other  No.    Positive for musculoskeletal as reported in HPI.    Negative for fever, chills, fatigue, night sweats, chest pain, chest  tightness, difficulty breathing, heart palpitations, skin rash, tooth/mouth  pain, blackout, fainting, blurry vision, double vision, stomach pain,  heartburn, constipation, diarrhea, dysuria, hematuria, bleeding problems,  anxiety.      Reason for Appointment    ---     1\. 6W  FU LT KNEE PAIN    ---     History of Present Illness    ---     _GENERAL_ Byrd Hesselbach is now 6 weeks out from left knee steroid injection for mild DJD. She  reports she is 80% better. She did a bit of PT but was feeling too good to  warrant returning. No new complaints.     Vital Signs    ---    Pain scale 7, Ht-in 5 ft 6 in, Ht-cm 167.64.     Physical Examination    ---    General Well-appearing, No acute distress.    On examination of the LLE, the skin of the left knee is intact, without  swelling, ecchymosis or deformity. Mild TTP of the anterior lateral of knee.  Neurovascular intact. ROM 0-120.     Assessments    ---    1\.  Primary osteoarthritis of left knee - M17.12 (Primary)    ---     Treatment    ---      **1\. Primary osteoarthritis of left knee**    Notes: Latrisa is doing very well after her knee injection. No new for further  intervention at this point given her degree of improvement. She can do PT,  ice, tylenol, NSAIDs as needed. Otherwise, we will see her back as needed for  recurrent pain or new problems.    ---     Follow Up    ---    prn    **Appointment Provider:** GEOFFREY STOKER    Electronically signed by Baron Hamper , MD on 04/10/2016 at 03:44 PM EST    Sign off status: Completed        * * *        [facility]    [address]    [place], [place] [address]    Tel: [phone]    Fax: [phone]              * * *         Patient: MURNA, BACKER DOB: 14-Apr-1970 Progress Note: GEOFFREY STOKER  04/05/2016    ---    Note generated by eClinicalWorks EMR/PM Software ([email or web address])

## 2016-07-14 ENCOUNTER — Ambulatory Visit: Admit: 2016-07-14 | Payer: Commercial Managed Care - PPO

## 2016-07-14 ENCOUNTER — Ambulatory Visit

## 2016-07-14 DIAGNOSIS — Z1231 Encounter for screening mammogram for malignant neoplasm of breast: Principal | ICD-10-CM

## 2016-07-14 DIAGNOSIS — ? MILD INTERMIT ASTHMA UNCO: Secondary | ICD-10-CM

## 2016-07-16 ENCOUNTER — Ambulatory Visit

## 2016-07-16 NOTE — Progress Notes (Signed)
[facility] Jul 16, 2016  [address]   [place]  [place] [address]  Main: [phone]  Fax: [phone]  Patient Portal: [email or web address]              Kearstyn Garduno  [address]  [place], [place]  [address]                                MR#: [id number]                                         DOB:09-02-1970    Dear  Ms. Haertel,      The results of the tests performed during your visit are as follows:    The mammogram showed no evidence of malignancy.     Please call me if you have any questions.      Sincerely,      Ailene Ards, MD  [facility]  [phone]    ** Please be aware of our new extended hours to better care for you. Hours are: Monday through Thursday from 8 am to 8 pm; Friday from 8 am to 5 pm; Saturday from 8 am to 12 noon.        Created By Ailene Ards MD on 07/16/2016 at 03:25 PM    Electronically Signed By Ailene Ards MD on 07/16/2016 at 03:25 PM

## 2017-06-24 ENCOUNTER — Ambulatory Visit

## 2017-06-24 NOTE — Progress Notes (Signed)
[facility] June 24, 2017  [address]   [place]  [place] [address]  Main: [phone]  Fax: [phone]  Patient Portal: [email or web address]            Lyrical Muraoka  [address]  [place], [place]  [address]      Dear  Jim Desanctis,  Happy Birthday From Primary Care at [facility]!    Because this is your birthday month, we think it is a great time to look ahead at your health needs for the coming year.  Most people benefit from an annual exam.     Based on national guidelines and on our records, the list below shows tests we think you will need in the upcoming months.  Please review the following information--if you have had one of these tests done outside of [facility], please let us know and have the results sent to [place] so that we have your complete medical record on file and can take the best possible care of you.     Name   How often due  Last done Status     Pap smear     Every 3-5 years  12/05/2015 DUE              If you and your primary care provider have decided upon different due dates or an alternative approach to testing based on your individual medical needs please disregard these recommendations.     The CDC (Centers for Disease Control and Prevention) recommends that everyone 65 and over and younger adults who are at high risk for pneumoccocal disease (heart disease, diabetes, etc...) receive the Pneumoccal Vaccine. Please speak with your Provider for more information.     We now have a Patient Portal [[email or web address]] so you can review your medical records, refill prescriptions and request appointments. Our hours are now longer - open until 8pm Monday through Thursday, and Saturday hours. You may also call our offices at [phone] with questions or to request an appointment.    Have a wonderful, safe, and healthy year.    Sincerely,   Your Care Team at [facility]  on behalf of Ailene Ards,  MD      Created By Ann Held on 06/24/2017 at 11:22 AM    Electronically Signed By Ann Held on 06/24/2017 at 11:22 AM

## 2017-09-04 ENCOUNTER — Ambulatory Visit: Admit: 2017-09-04 | Payer: HMO

## 2017-09-04 ENCOUNTER — Ambulatory Visit

## 2017-09-04 DIAGNOSIS — Z0000 Encounter for general adult medical examination without abnormal findings: Principal | ICD-10-CM

## 2017-09-04 DIAGNOSIS — M179 Osteoarthritis of knee, unspecified: Secondary | ICD-10-CM

## 2017-09-04 LAB — HX CHOLESTEROL
HX CHOLESTEROL: 166 mg/dL (ref 110–199)
HX HIGH DENSITY LIPOPROTEIN CHOL (HDL): 53 mg/dL (ref 35–75)
HX LDL: 100 mg/dL (ref 0–129)
HX TRIGLYCERIDES: 66 mg/dL (ref 40–250)

## 2017-09-04 LAB — HX CHEM-LIPIDS
HX CHOL-HDL RATIO: 3.1
HX CHOLESTEROL: 166 mg/dL (ref 110–199)
HX HIGH DENSITY LIPOPROTEIN CHOL (HDL): 53 mg/dL (ref 35–75)
HX HOURS FAST: 8 h
HX LDL: 100 mg/dL (ref 0–129)
HX TRIGLYCERIDES: 66 mg/dL (ref 40–250)

## 2017-09-04 LAB — HX CHEM-METABOLIC: HX HEMOGLOBIN A1C: 5.2 %

## 2017-09-04 LAB — HX DIABETES: HX HEMOGLOBIN A1C: 5.2 %

## 2017-09-04 NOTE — Progress Notes (Signed)
[facility] September 04, 2017  [address]   [place]  [place] [address]  Main: [phone]  Fax: [phone]  Patient Portal: [email or web address]            [name]  [address]  [place], [place]  [address]  [facility] Clinical Summary   [name]  DOB: [birth year]   MR#: [id number]  Primary Care Doctor: Ailene Ards, MD  [phone]    Summary of visit September 04, 2017 with Ailene Ards, MD:   Annual:   blood pressure: good   check diabetes and cholesterol screens today   pap smear updated   schedule mammogram     Follow up in one year - return sooner if needed     Labs, Tests and Referrals ordered:  Pap Smear [CPT-88150]  HPV Screening (Pt > 30 years) [CPT-87621]  Hemoglobin A1C (Glycohemoglobin) [CPT-83036]  Lipid Profile-Chol, HDL, Trig, LDL(calc) [CPT-80061]  Mammo Screening [CPT-77057]    Medications added or changed today include:      Medications removed today include:  ALAWAY 0.025 % OPHTHALMIC SOLUTION (KETOTIFEN FUMARATE) use twoce ad ay on bot h eyes for 1 month  VITAMIN D3 1000 UNIT ORAL TABLET (CHOLECALCIFEROL) Take one tablet by mouth once daily; Route: ORAL  * COMPRESSION STOCKING 30-40 MMHG use as directed daily. ICD 10: I83.893, [place] PPO: [id number]    For a complete listing of all of your current medications, please see the last page of this Clinical Visit Summary.    Problems associated with your visit include:  Added new problem of SCREENING FOR MALIGNANT NEOPLASM, CERVIX (ICD-V76.2) (TIW58-K99.4)  Changed problem from [facility] EXAM - OCT 2017 (ICD-V70.0) (ICD10-Z00.00) to [facility] - JUNE 2019 (ICD-V72.31) (ICD10-Z00.00)  Added new problem of SCREENING MAMMOGRAM (ICD-V76.12) (ICD10-Z12.31)    Problem list on 09/04/2017:  SCREENING MAMMOGRAM (ICD-V76.12) (ICD10-Z12.31)  SCREENING FOR MALIGNANT NEOPLASM, CERVIX (ICD-V76.2) (IPJ82-N05.4)  KNEE PAIN, LEFT (ICD-719.46) (ICD10-M25.562)  VITAMIN D DEFICIENCY (ICD-268.9)  (ICD10-E55.9)  ALLERGIC CONJUNCTIVITIS (ICD-372.14) (ICD10-H10.10)  PTERYGIUM (ICD-372.40) (ICD10-H11.009)  Hx of HERNIA, UMBILICAL (ICD-553.1) (LZJ67-H41.9)  Hx of OVARIAN CYST (ICD-620.2) (ICD10-N83.20)  ANNUAL EXAM - JUNE 2019 (ICD-V72.31) (ICD10-Z00.00)    Most recent vital signs observed:  Height: 63.25 inches   Weight: 182.6 pounds   *BMI: 32.21  Temperature: 98.2 degrees F   Pulse Rate: 65 beats/minute   Blood Pressure: 122 / 78 mmHg     *BMI (Body Mass Index) is a measure of healthy weight based on height.  For more information go to [email or web address]  Categories:   Underweight = <18.5   Normal weight = 18.5-24.9   Overweight = 25-29.9           Obesity = BMI of 30 or greater    Most recent blood tests ordered by your Primary Care team as of 09/04/2017:  Cholesterol Tests   Cholesterol 167 Normal = 110-199 11/29/2015   HDL (good cholesterol) 50 Normal > 40 11/29/2015   Triglyceride 55 Normal = 40-250 11/29/2015   LDL (bad cholesterol) 106 Normal < 160  Normal < 130 if you have high blood pressure  Normal < 100 if you have heart disease or diabetes 11/29/2015   Diabetes Tests   Glucose random 84 Normal = 60-150 02/15/2014   HgbA1c 5.4 Normal = 4.3-5.6 11/29/2015   Blood Count Tests   Hematocrit 38.7 Normal = 37-47 02/15/2014   White Blood Cells 8.1 Normal = 4-11 02/15/2014   Platelets 231 Normal = 150-400 02/15/2014  Liver Tests   ALT 11 Normal = 0-54 02/15/2014   AST 15 Normal = 10-42 02/15/2014   Kidney Tests   BUN 13 Normal = 6-24 02/15/2014   Creatinine 0.74 Normal = 0.57-1.30 02/15/2014   GFR (Non African-American) 100 Normal > 60 02/15/2014   GFR (African-American) 116 Normal > 60 02/15/2014     General Medical Advice:  If you think any of the information provided in this summary is incorrect, please notify your provider.    Always carry a list of current medications with you . Provide an updated list to your Primary care Physician or any provider who prescribes your medication.    We  have a doctor on call during our off hours--if you have an urgent problem and need to speak with a doctor when our office is closed, please call [phone].      [facility] Medication List  [facility]  [name]  DOB: 03-29-1970   MR#: [id number]  Primary Care Doctor: Ailene Ards, MD  [phone]    Medications on 09/04/2017:    Allergies on 09/04/2017: (if it says "NKA" below, that means "No known allergies")  NKA    Advice about Medications:  Always carry a list of current medications with you . Provide an updated list to your Primary care Physician or any provider who prescribes your medication.    Remember to keep your list updated. Include all over the counter medications such as vitamins and herbals.    Discard all old medication lists.    If you are a specialist seeing this patient and you have made changes to any of the medications above,   please mark the changes on this medication list.    Many other factors can affect your health. If you are struggling with transportation, paying bills, getting food, getting work, then visit one of these websites:  http://[email or web address]/  https://www.healthify.us/  [email or web address].1degree.[email or web address]      Created By Ailene Ards MD on 09/04/2017 at 10:44 AM    Electronically Signed By Ailene Ards MD on 09/04/2017 at 10:45 AM

## 2017-09-04 NOTE — Progress Notes (Signed)
General Medicine Visit  .  Service Due by Standard Protocol Rules: PAP SMEAR.    .Initial Screening   * Paris: Clark (Katrina)  Ht: 63.25 in.  Wt: 182.6 lbs.   BMI: 32.21  Temp: 98.2 deg F.     BP (Initial Shannon Screening): 122 / 78      BP (Rechecked, Actionable): 122 /   78 mmHg   HR: 65     Health Mgmt materials? Weight Education Handout for high BMI  .  Med List: PRINTED by [facility] for patient   Chronic Pain Assessment: Does pt experience chronic pain ? NO  Smoking Assessment: Tobacco use? never smoker  .  Self Mgmt materials provided? Printed handout: Seasonal Allergies  .  Marland Kitchen  Falls Risk Assessment:   In the past year, have you ... Had no falls  Difficulty with balance? NO  Need assistance with ambulation while here? NO  .  Behavioral Health Tools:   PHQ2 Screening Score 0  .  Marland Kitchen  Chief Complaint:   annual   .  History of Present Illness:   Here with daughter who translates today  .  KNEE PAIN, LEFT: been better, sometimes does hurt. Much better after inject  ion  VITAMIN D DEFICIENCY  ALLERGIC CONJUNCTIVITIS  PTERYGIUM  .  would like skin tags on neck removed  .  .  .  Past Medical History:(reviewed)  KNEE OSTEOARTHRITIS  VITAMIN D DEFICIENCY  ALLERGIC CONJUNCTIVITIS  PTERYGIUM  Hx of HERNIA, UMBILICAL  Hx of OVARIAN CYST  .  HCM:  Tdap: 2011  Zoster: at age 47  Prevnar/Pneumovax: at age 80  HAV/HBV status:  Pap: Oct 2010: NIL/HPV-, May 2014: NIL/HPV-, June 2019:   Mammo: May 2018: wnl   Colonoscopy: at age 46  DEXA: at age 45  .  Family History: (reviewed)   M: healthy  F: healthy  Siblings: 8, healthy, one with surgery for valvulopathy  Children: 3, healthy   no hx of breast, ovarian, or colon ca in family, no hx heart disease or str  okes  .  Social History: (reviewed)   Works: not currently working, previously worked as a Advertising copywriter  Tobacco: never   EtOH: none   IVDU: none  Exercise: walks   Diet: balanced, about 2 servings calcium/day   Seatbelts/sunscreen: Y   Feels safe at home: Y, lives with family  Sexually  active: Y  Contraception: vasectomy   Periods: regular  .  Marland Kitchen  A complete ROS was done. All positive responses are listed in HPI section.   All other systems reviewed in detail and are negative.  .  .  Past Medical History (prior to today's visit):  VITAMIN D DEFICIENCY (ICD-268.9) (ICD10-E55.9)  ALLERGIC CONJUNCTIVITIS (ICD-372.14) (ICD10-H10.10)  KNEE PAIN, LEFT (ICD-719.46) (ICD10-M25.562)  PTERYGIUM (ICD-372.40) (ICD10-H11.009)  Hx of HERNIA, UMBILICAL (ICD-553.1) (FTD32-K02.9)  Hx of OVARIAN CYST (ICD-620.2) (ICD10-N83.20)  ANNUAL EXAM - OCT 2017 (ICD-V70.0) (ICD10-Z00.00)  .  Past Medical History (changes today):  Added new problem of SCREENING FOR MALIGNANT NEOPLASM, CERVIX (ICD-V76.2) (  RKY70-W23.4)  Changed problem from ANNUAL EXAM - OCT 2017 (ICD-V70.0) (ICD10-Z00.00) to A  NNUAL EXAM - JUNE 2019 (ICD-V72.31) (ICD10-Z00.00)  Added new problem of SCREENING MAMMOGRAM (ICD-V76.12) (ICD10-Z12.31)  Changed problem from KNEE PAIN, LEFT (ICD-719.46) (ICD10-M25.562) to OSTEOA  RTHRITIS, KNEES (ICD-715.96) (ICD10-M17.9)  Added new problem of SKIN TAGS (ICD-701.9) (ICD10-L91.8)  Problems Reviewed:  Done  .  Marland Kitchen  Medications (prior to today's visit):  ALAWAY 0.025 %  OPHTHALMIC SOLUTION (KETOTIFEN FUMARATE) use twoce ad ay on   bot h eyes for 1 month  VITAMIN D3 1000 UNIT ORAL TABLET (CHOLECALCIFEROL) Take one tablet by mouth   once daily; Route: ORAL  * COMPRESSION STOCKING 30-40 MMHG use as directed daily. ICD 10: I83.893, T  [facility] PPO: [id number]  .  No Known Medications  Medications Reviewed:  Done  .  Marland Kitchen  No Known Allergies  Allergies Reviewed:  Done  .  .  .  .  .  Vitals:   Ht: 63.25 in.  Wt: 182.6 lbs.  BMI (in-lb) 32.21  Temp: 98.2deg F.     BP (Initial Vincent Screening): 122 / 78     BP (Rechecked, Actionable): 122 / 7  8 mmHg   Pulse Rate: 65 bpm  .  .  Additional PE:   Gen: Alert, NAD, well hydrated, well developed  Head: NCAT  Ears: no external deformities, canals clear, TMs pearly gray, gross hearing    intact  Eyes: EOMI, PERRL, no scleral icterus, no conjunctival injection, vision   grossly normal, pterygium present bilaterally in eyes, left > right   Nose: no bleeding  Mouth: + upper dentures, no lesions, tongue midline, no exudate/erythema   Neck: Supple, no ant/post cervical or supraclavicular lymphadenopathy, thyr  oid not palpable  CV: RRR, no M/R/G, no edema, DP 2+ b/l  Pulm: CTA b/l, resp effort wnl  Abd: soft, nontender, nondistended  Breast: no axillary LAD, breasts symmetric, no nipple discharge, skin chang  es or dominant masses   GU: external labia nml, physiologic d/c in vag vault, mucosa nml. No CMT  Musc: station and gait wnl, moves all extremities without obvious   limitations  Skin: No rashes, no obvious bruising, no suspicious lesions. mult nevi, mul  t hyperpigmented soft skin tags on neck  Neuro: CN 2-12 grossly intact  Psych: Mood and affect appropriate  .  Marland Kitchen  Assessment /T/ Plan:   47 yo woman here for annual  OSTEOARTHRITIS, KNEES: better after injections, not bugging right now  ANNUAL EXAM:   bp: good   check cholesterol and diab screens   pap smear updated today   schedule mammogram   will refer to derm for skin check and removal of skin tags on neck  .  fu 12 mths for annual, sooner as needed   .  Marland Kitchen  Orders (this visit):  Pap Smear [CPT-88150]  HPV Screening (Pt > 30 years) [CPT-87621]  Hemoglobin A1C (Glycohemoglobin) [CPT-83036]  Lipid Profile-Chol, HDL, Trig, LDL(calc) [CPT-80061]  Mammo Screening [CPT-77057]  RTC in 12 months [RTC-365]  Est Preventive 40-64yo [CPT-99396]  [facility] [Ref-Derm]  .  Patient Care Plan  .  Marland Kitchen  Immunization Worksheet 2016   .  .  Marland Kitchen  Electronically Signed by Ailene Ards, MD on 09/04/2017 at 8:47 PM  ________________________________________________________________________

## 2017-09-05 ENCOUNTER — Ambulatory Visit

## 2017-09-05 NOTE — Progress Notes (Signed)
[facility]  [address]  [place] [address]  Main: [phone]  Fax: [phone]  Patient Portal: [email or web address]      September 13, 2017            MRN: [id number]            DOB: 01-27-1971   [name]   [address]   [place] [address]      The results of your recent tests are as follows:      Cholesterol Tests:  Good     Total Cholesterol   166  Normal 110-199    09/04/2017    HDL (good cholesterol)  53  Normal >40     09/04/2017    Triglyceride    66  Normal 40-250    09/04/2017    LDL (bad cholesterol)   100  Normal <160    09/04/2017    10-year ASCVD Event Risk:  0.6 % Goal <7.5%, (note 1)       Diabetes Tests:  Good - no diabetes    HgbA1c:    5.2  Normal 4.3 - 5.6    09/04/2017     Other Tests:  The pap smear was normal and negative for HPV. You will be due for your next screening pap smear in five years (July 2024).      It was nice to see you. Please let me know if you have any questions.       Notes on certain labs     Note 1 =  ASCVD = Athero-Sclerotic Cardio-Vascular Disease. This percentage represents the risk that you     will have either a heart attack or a stroke within the next 10 years. High-risk is defined     as > 7.5%. We recommend patients in this category take a medicine called a statin to lower this risk.     Sincerely,       Ailene Ards, MD    [facility]  [phone]  Results Provided by: Mail           Created By Sharyn Dross on 09/05/2017 at 04:26 PM    Electronically Signed By Ailene Ards MD on 09/13/2017 at 08:37 AM

## 2017-09-12 LAB — HX CYTO GYN & NON GYN

## 2017-09-14 ENCOUNTER — Ambulatory Visit

## 2017-10-24 ENCOUNTER — Ambulatory Visit

## 2017-11-08 ENCOUNTER — Ambulatory Visit: Admit: 2017-11-08 | Payer: HMO

## 2017-11-08 ENCOUNTER — Ambulatory Visit: Admitting: Dermatology

## 2017-11-08 ENCOUNTER — Ambulatory Visit

## 2017-11-08 ENCOUNTER — Ambulatory Visit (HOSPITAL_BASED_OUTPATIENT_CLINIC_OR_DEPARTMENT_OTHER): Admitting: Psychiatry

## 2017-11-08 DIAGNOSIS — L819 Disorder of pigmentation, unspecified: Secondary | ICD-10-CM

## 2017-11-08 DIAGNOSIS — D229 Melanocytic nevi, unspecified: Principal | ICD-10-CM

## 2017-11-08 DIAGNOSIS — L918 Other hypertrophic disorders of the skin: Secondary | ICD-10-CM

## 2017-11-08 DIAGNOSIS — L821 Other seborrheic keratosis: Secondary | ICD-10-CM

## 2017-11-08 MED ORDER — Triamcinolone Acetonide: 0.1 | g | 3 refills | 0 days | Status: AC

## 2017-11-08 NOTE — Progress Notes (Signed)
**Progress Notes**    ---    **Patient:** [name], [name]     **Account Number:** [id number] **External MRN:** [id number]   **Appointment  Provider:** Brigitte Pulse, MD     **DOB:** Jan 08, 1971 **Age:** 72 Y **Sex:** Female   **Supervising Provider:**  Laddie Aquas, MD     **Phone:** [phone]   **Date:** 11/08/2017   **CHN#:** [id number]     **Address:** [address], [place], [id number]     **Pcp:** Phillips Grout, MD        * * *        **Subjective:**        ---       **Chief Complaints:**    --- ---       1\. NP/SKIN TAG REMOVAL/INTER BKD/GMA REF. 2. Evaluation of dermatologic  issues.    --- ---      **HPI:**    _GENERAL_ :    CC: eval of skin lesions    Portugese speaking (daughter present today for interpretation)    Patient presents for evaluation and management of the following.    New patient. Last seen in 2014 by Dr. 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 actinic damage - (b/l  arms) TAC 0.1% ointment    ----    Concerns today:    1\. Skin tags    Location: Neck    Duration: Years    Symptoms: Itchy    Current Treatments: None    Previous Treatments: None    Other: No personal history of skin cancer.    2\. Skin color changes: On b/l arms and neck. Present since 2014 and used TAC  0.1% ointment which she stated helped but now the color changes have returned  1 year ago. Denies bleeding, itching, pain.    ----    Family history:    skin cancer - negative    atopic dermatitis - negative    ----    Social history: non-smoker    ----    ROS: No other skin concerns. Patient feels well, denies fevers or chills.    --- ---       **Medical History:** Varicose veins.        --- ---      **Medications:** Not-Taking/PRN Vitamin D , Medication List reviewed and  reconciled with the patient    --- ---       **Allergies:** N.K.D.A.    --- ---         **Objective:**        ---       **Vitals:** Pain scale 0, Ht-in 5 ft 6 in, Ht-cm 167.64.    --- ---          **Assessment:**        ---       **Assessment:**    1\. 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 Hyperpigmentation - L81.9    4\. Acrochordon - L91.8    --- ---      PHYSICAL EXAMINATION:    GENERAL: Well appearing and developed, in no acute distress    MOOD/AFFECT: Within normal limits    SKIN: A skin examination was performed including head/face, eyes, lips, neck,  right upper extremity, left upper extremity, hands/fingers/fingernails, chest,  abdomen, back, axillae,***right lower extremity, left lower extremity,  feet/toenails, genitalia/ buttocks, scalp.  All findings were negative in the  physical exam above other than what is noted below. Pertinent exam findings  include (see embedded into assessment below):    *****    ASSESSMENT:    1\. Melanocytic nevi, benign: scattered brown to skin colored papules and  macules with regular pigment network noted on dermoscopy    2\. Seborrheic keratoses, benign: scattered brown round stuck on appearing  papules    3\. Acrochordon, benign: skin colored to hyperpigmented pedunculated papules  on the neck    - s/p TBSE today.    - Counseled on etiology of findings.    - Continue to monitor with monthly self skin exams    - ABCDEs of melanoma and importance of sun protection reviewed, including  SPF, clothing protection and avoidance of tanning    - Pt advised to return to clinic for any new or concerning lesions.    - Lesions not suspicious for malignancy based on clinical exam and dermoscopy  findings; Do not recommend removal at this time    2\. Hyperpigmentation: hyperpigmented patches with a slightly rippled  appearance on the b/l forearms and anterior neck    - Ddx macular amyloid vs. photosensitive disorder given distribution    - She experienced improvement with TAC 0.1% ointment in the past so will re-  prescribe today    - Start Triamcinolone 0.1% ointment once to twice daily for 5 days a week.  Stop when clear and  restarting as needed. For severe cases may use twice daily  for 2 weeks straight. Side effects discussed with patient: skin thinning,  discoloration, rare systemic absorption.    RTC PRN.         **Plan:**        ---         **1\. Others**    Start Triamcinolone Acetonide Ointment, 0.1 %, once to twice daily for 5 days  a week. Stop when clear and restart as needed, Externally, as directed, 14  days, 80 g, Refills 3 .    ---      **Follow Up:** prn    --- ---    ---    ---                ---    **Appointment Provider:** Brigitte Pulse, MD    ---     **Patient:** [name], [name] **DOB:** 01-Feb-1971 **Date:** 11/08/2017    ---    Electronically signed by Sheran Luz MD on 11/08/2017 at 01:24 PM EDT    Sign off status: Completed

## 2017-11-08 NOTE — Progress Notes (Signed)
.  Progress Notes  .  Patient: [name]  Provider: Brigitte Pulse  MD  .  DOB: June 29, 1970 Age: 47 Y Sex: Female  Supervising Provider:: Laddie Aquas, MD  Date: 11/08/2017  .  PCP: Phillips Grout MD  Date: 11/08/2017  .  --------------------------------------------------------------------------------  .  REASON FOR APPOINTMENT  .  1. NP/SKIN TAG REMOVAL/INTER BKD/GMA REF. 2. Evaluation of  dermatologic issues.  Marland Kitchen  HISTORY OF PRESENT ILLNESS  .  GENERAL:   CC: eval of skin lesions Portugese speaking (daughter present  today for interpretation)Patient presents for evaluation and  management of the following. New patient. Last seen in 2014 by  Dr. Beacher May ----Derm Problem List:Dermatisis - ashy dermatosis vs.  lichen amyloid vs. actinic damage - (b/l arms) TAC 0.1% ointment  ----Concerns today:1. Skin tagsLocation: NeckDuration:  YearsSymptoms: ItchyCurrent Treatments: NonePrevious Treatments:  NoneOther: No personal history of skin cancer.2. Skin color  changes: On b/l arms and neck. Present since 2014 and used TAC  0.1% ointment which she stated helped but now the color changes  have returned 1 year ago. Denies bleeding, itching,  pain.----Family history:skin cancer - negativeatopic dermatitis -  negative----Social history: non-smoker----ROS: No other skin  concerns. Patient feels well, denies fevers or chills.  .  CURRENT MEDICATIONS  .  Not-Taking/PRN Vitamin D , Medication List reviewed and  reconciled with the patient  .  PAST MEDICAL HISTORY  .  Varicose veins.  .  ALLERGIES  .  N.K.D.A.  .  VITAL SIGNS  .  Pain scale 0, Ht-in 5 ft 6 in, Ht-cm 167.64.  .  ASSESSMENTS  .  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  Hyperpigmentation - L81.9  .  Acrochordon - L91.8  .  PHYSICAL EXAMINATION:GENERAL: Well appearing and developed, in no  acute distressMOOD/AFFECT: Within normal limitsSKIN: A skin  examination was performed including head/face, eyes, lips, neck,  right  upper extremity, left upper extremity,  hands/fingers/fingernails, chest, abdomen, back, axillae,***right  lower extremity, left lower extremity, feet/toenails, genitalia/  buttocks, scalp. All findings were negative in the physical exam  above other than what is noted below. Pertinent exam findings  include (see embedded into assessment below):*****ASSESSMENT: 1.  Melanocytic nevi, benign: scattered brown to skin colored papules  and macules with regular pigment network noted on dermoscopy 2.  Seborrheic keratoses, benign: scattered brown round stuck on  appearing papules3. Acrochordon, benign: skin colored to  hyperpigmented pedunculated papules on the neck- s/p TBSE today.-  Counseled on etiology of findings. - Continue to monitor with  monthly self skin exams - ABCDEs of melanoma and importance of  sun protection reviewed, including SPF, clothing protection and  avoidance of tanning- Pt advised to return to clinic for any new  or concerning lesions.- Lesions not suspicious for malignancy  based on clinical exam and dermoscopy findings; Do not recommend  removal at this time2. Hyperpigmentation: hyperpigmented patches  with a slightly rippled appearance on the b/l forearms and  anterior neck- Ddx macular amyloid vs. photosensitive disorder  given distribution- She experienced improvement with TAC 0.1%  ointment in the past so will re-prescribe today- Start  Triamcinolone 0.1% ointment once to twice daily for 5 days a  week. Stop when clear and restarting as needed. For severe cases  may use twice daily for 2 weeks straight. Side effects discussed  with patient: skin thinning, discoloration, rare systemic  absorption. RTC PRN.  .  TREATMENT  .  Others  Start Triamcinolone Acetonide Ointment, 0.1 %, once to twice  daily for 5 days a week. Stop when clear and restart as needed,  Externally, as directed, 14 days, 80 g, Refills 3  .  FOLLOW UP  .  prn  .  Marland Kitchen  Appointment Provider: Brigitte Pulse,  MD  .  Electronically signed by Sheran Luz MD on  11/08/2017 at 01:24 PM EDT  .  CONFIRMATORY SIGN OFF  ROTHSTEIN,BROOKE , MD 11/08/2017 10:21:41 AM > HOOT,JOYCE W, MD 11/08/2017 1:24:51 PM > I personally interviewed and examined the patient and both the resident and I contributed to this electronic note. I agree with the history, exam, assessment and plan as detailed in this note and edited it as necessary.  .  Document electronically signed by Brigitte Pulse  MD  .

## 2018-01-30 ENCOUNTER — Ambulatory Visit: Admit: 2018-01-30 | Payer: Commercial Managed Care - PPO

## 2018-01-30 ENCOUNTER — Ambulatory Visit

## 2018-01-30 DIAGNOSIS — Z1231 Encounter for screening mammogram for malignant neoplasm of breast: Principal | ICD-10-CM

## 2018-01-30 DIAGNOSIS — ? GERD WITHOUT ESOPHAGITIS: Secondary | ICD-10-CM

## 2018-01-30 NOTE — Progress Notes (Signed)
[facility]  [address]  [place] [address]  Main: [phone]  Fax: [phone]  Patient Portal: [email or web address]      January 30, 2018            MRN: [id number]            DOB: Mar 22, 1970   [name]   [address]   [place] [address]      The results of your recent tests are as follows:      Your mammogram was normal. You will be due for your next mammogram next year.     Please let me know if you have any questions,            Sincerely,       Ailene Ards, MD  [facility]  [phone]  Results Provided by: Mail           Created By Ailene Ards MD on 01/30/2018 at 05:23 PM    Electronically Signed By Ailene Ards MD on 01/30/2018 at 05:24 PM

## 2018-11-13 ENCOUNTER — Ambulatory Visit

## 2019-07-21 ENCOUNTER — Ambulatory Visit

## 2019-07-21 NOTE — Progress Notes (Signed)
 [facility] Jul 21, 2019  [address]  [place] [address]  Main: [phone]  Fax:   Patient Portal: [email or web address]            [name]  [address]  [place], [place]  [address]      Dear  Ms. Hinze,      I am writing this letter to inform you that I will be leaving my practice at [facility] on October 10, 2019, to return to the [place] and be closer to family.  It has been an honor to be your primary care provider.    Your care at [facility] will continue with one of our many excellent physicians.  I am in the process of reviewing all of my patients, and making specific suggestions on your new PCP.   If you would like to make plans on your own to move your care to another of our providers, please note that many are accepting new patients, including:  Dr. Bretta Bang  Dr. Darron Doom  Dr. Feliberto Harts  Dr. Elonda Husky  Dr. Lezlie Lye    It is important to establish care with one of these physicians, so that we can continue to provide for your healthcare needs. You may call [phone] to arrange an appointment with one of these physicians or other available physician at [facility].   In early August, my office will send you a follow-up letter with the name of your new doctor.     If you have any type of HMO insurance, please be sure to let them know which doctor you are choosing as your new primary care doctor.    Thank you for allowing me the opportunity to take care of you during my time at [facility]. I wish you good health and happiness in the future.      Sincerely,        Conchita Paris, MD      Created By Ann Held on 07/21/2019 at 09:42 AM    Electronically Signed By Ann Held on 07/21/2019 at 09:42 AM

## 2019-08-13 ENCOUNTER — Ambulatory Visit: Admit: 2019-08-13 | Payer: Commercial Managed Care - PPO

## 2019-08-13 ENCOUNTER — Ambulatory Visit

## 2019-08-13 ENCOUNTER — Ambulatory Visit: Admitting: Adult Health

## 2019-08-13 DIAGNOSIS — Z0000 Encounter for general adult medical examination without abnormal findings: Principal | ICD-10-CM

## 2019-08-13 LAB — HX CHEM-LIPIDS
HX CHOL-HDL RATIO: 3
HX CHOLESTEROL: 186 mg/dL (ref 110–199)
HX HIGH DENSITY LIPOPROTEIN CHOL (HDL): 63 mg/dL (ref 35–75)
HX HOURS FAST: 0 h
HX LDL: 110 mg/dL (ref 0–129)
HX TRIGLYCERIDES: 64 mg/dL (ref 40–250)

## 2019-08-13 LAB — HX CHEM-PANELS
HX ANION GAP: 8 (ref 3–14)
HX BLOOD UREA NITROGEN: 12 mg/dL (ref 6–24)
HX CHLORIDE (CL): 105 meq/L (ref 98–110)
HX CO2: 26 meq/L (ref 20–30)
HX CREATININE (CR): 0.75 mg/dL (ref 0.57–1.30)
HX GFR, AFRICAN AMERICAN: 110 mL/min/{1.73_m2}
HX GFR, NON-AFRICAN AMERICAN: 95 mL/min/{1.73_m2}
HX POTASSIUM (K): 3.9 meq/L (ref 3.6–5.1)
HX SODIUM (NA): 139 meq/L (ref 135–145)

## 2019-08-13 LAB — HX CHOLESTEROL
HX CHOLESTEROL: 186 mg/dL (ref 110–199)
HX HIGH DENSITY LIPOPROTEIN CHOL (HDL): 63 mg/dL (ref 35–75)
HX LDL: 110 mg/dL (ref 0–129)
HX TRIGLYCERIDES: 64 mg/dL (ref 40–250)

## 2019-08-13 LAB — HX CHEM-METABOLIC: HX HEMOGLOBIN A1C: 5.6 %

## 2019-08-13 LAB — HX CHEM-TOLERANCE: HX GLUCOSE, FASTING: 93 mg/dL (ref 70–99)

## 2019-08-13 LAB — HX DIABETES
HX GLUCOSE, FASTING: 93 mg/dL (ref 70–99)
HX HEMOGLOBIN A1C: 5.6 %

## 2019-08-13 NOTE — Progress Notes (Signed)
 General Medicine Visit  General Medicine Office Visit  .  Marland Kitchen  History of Present Illness   Chief Complaint: annual exam  History of Present Illness: 49 year old pateint of Dr suzukida here today f  or an annual of exam  completed her covid vaccine 07/2019  .  Left Knee Pain    no recent symptoms    .  Patient feels well   She is here today with her daughter   History from: patient  .  Marland Kitchen  Past, Family, and Social History  Past History (reviewed - no changes required): KNEE OSTEOARTHRITIS  VITAMIN D DEFICIENCY  ALLERGIC CONJUNCTIVITIS  PTERYGIUM  Hx of HERNIA, UMBILICAL  Hx of OVARIAN CYST  .  HCM:  Tdap: 2011  Zoster: at age 12  Prevnar/Pneumovax: at age 81  HAV/HBV status:  Pap: Oct 2010: NIL/HPV-, May 2014: NIL/HPV-, June 2019:   Mammo: May 2018: wnl   Colonoscopy: at age 48  DEXA: at age 89  Family History (reviewed - no changes required): M: healthy  F: healthy  Siblings: 8, healthy, one with surgery for valvulopathy  Children: 3, healthy   no hx of breast, ovarian, or colon ca in family, no hx heart disease or str  okes  Social History: Works: [employer] worked throught covid  Tobacco: never   EtOH: none   IVDU: none  Exercise: walks   Diet: balanced, about 2 servings calcium/day   Seatbelts/sunscreen: Y   Feels safe at home: Y, lives with family  Sexually active: Y  Contraception: vasectomy   Periods: regular  .  Marland Kitchen  Risk Factors   Tobacco use: never  Alcohol use: no  Drug use: denies  Unsafe/afraid at home: no  Seatbelt use (%): 100  Exercise (times/week): 4    Type of exercise: walks  .  .  .  Chronic Pain Assessment   Do you experience Chronic Pain ? No  .  .  Review of Systems   General: Complains of see HPI. Denies fevers, chills, sweats, anorexia, fat  igue, malaise, weight loss, sleep disturbance, headache.   Eyes: has had a recent eye exam   Ears/Nose/Throat: Denies nasal congestion.   Ophtho/Dental/Audiology: Complains of see HPI. up to date with dental   Cardiovascular: Denies chest pains, palpitations.    Respiratory: Denies cough, dyspnea.   Gastrointestinal: Complains of see HPI. Denies change in bowel habits, abdo  minal pain.   Genitourinary: Denies vaginal discharge, incontinence, dysuria, hematuria,   urinary frequency, amenorrhea, menorrhagia, abnormal vaginal bleeding, pelv  ic pain. periods regular   Last menstrual period: Complains of see HPI. LMP 08/06/2019  G3 P3   Last breast exam: : Complains of see HPI.   Musculoskeletal: Denies back pain, joint pain.   Skin: Denies rash, itching, dryness.   Neurologic: Complains of see HPI.   Psychiatric: Denies depression, anxiety.   Endocrine: Denies cold intolerance, heat intolerance, polydipsia, polyphagi  a, polyuria, weight change.   .  Vital Signs   Height: 63.25 inches  Weight: 190.4 pounds  Temperature: 98.2 degrees  F   Pulse rate: 64  Pulse rhythm: regular  Respirations: 18  O2 Sat: 100  Blood Pressure #1: 131/83 mm Hg  Blood Pressure #2: 131/83 mm Hg  Body Mass Index: 33.58  .  Marland Kitchen  Physical Examination  Constitutional: Alert, well hydrated, well developed, well nourished, appro  priate dress.   Skin: Normal turgor.   Ears: Right ear normal, Left ear normal.  Nose: non-tender.   Mouth: no erythema, no exudates.   Neck: supple, no adenopathy, no masses, thyroid normal size, no thyroid ten  derness or nodules.   Breasts: skin/areolae normal, no masses, no nipple discharge, no erythema/w  armth/tenderness, axillae normal.   Cardiovascular: RRR, no edema.   Respiratory: clear to auscultation.   Abdomen: soft, normal BS.   Neck,Spine,ribs,pelvis: normal gait.   Psych: Oriented to all spheres, affect and mood appropriate, normal interac  tion, good eye contact.   Assessment   New Problems:  ANNUAL EXAM (ICD-V70.0) (ICD10-Z00.00)  SCREENING CANCER, BREAST (ICD-V76.12) (ICD10-Z12.31)  .  No Known Allergies  .  Plan   Discussed adverse effects of medications, dosing issues, and adherence requ  irements  49 year old patient of Dr Nicola Girt here today for a  comprehensive review  Patient feels well  No new complaints today  .  Left Knee Pain    no current symptoms  .  Routine Health Maintenance  .  Tdap: 2011  Zoster: at age 77  Prevnar/Pneumovax: at age 55  Pap: Oct 2010: NIL/HPV-, May 2014: NIL/HPV-, June 2019:  repeat 2024  Mammo: May 2019 wnl    book today  Colonoscopy: at age 67  DEXA: at age 77  Dermatology appt booked skin check  Cholesterol screening today  Diabetes screening today  Disposition: return to clinic as needed   if sx worsen or persist  .  .  .  Orders   .  Est Preventive 40-64yo [CPT-99396]  Medications Reviewed [CPT-1160F]  Mammo Screening [CPT-77057]  LYTES [SODIUM] [CPT-82495]  BUN [BUN] [CPT-84520]  Creatinine (CR) [CREATININE] [CPT-82565]  Glucose fasting (FBS) [BG FASTING] [CPT-82947]  Hemoglobin A1C (Glycohemoglobin) [HGBA1C] [CPT-83036]  Lipid Profile-Chol, HDL, Trig, LDL(calc) [CHOLESTEROL] [CPT-80061]  [facility] [Ref-Derm]  .  .  .  .  HEP C AB.    .  * [place]: [place])  .  Patient Details and Vitals  Patient reviewed in/by:  Office  Patient Email Address Declined  .  BMI: 33.58  .  BP: 131/ 83  Ht (inches): 63.25  with shoes  Weight: 190.4  BMI: 33.58  Temp: 98.2  Resp Rate: 18  Pulse: 64  O2 Sat: 100  .  Med List: PRINTED byfor patient   hd_medl: printed  .  Marland Kitchen  Patient Medical History   Travel outside of the [place] in past 28 days:: No  .  In the past year, have you ... Had no falls  Difficulty with balance? NO  Use a Cane NO  Use a Walker NO  Need assistance with ambulation while here? NO  .  Marland Kitchen  Tobacco use? never smoker  Does patient experience chronic pain ? No  .  .  Patient Screening   .  SDOH: Housing situation: I have housing  SDOH: Run out or ability to purchase food: Never true  SDOH: Utility shutoff: No  SDOH: Miss appt lack transportation: No  SDOH: unemployed or looking for work: No  SDOH: Socially withdrawn: Never  .  .  .  PHQ2 (Q1) Little Interest or pleasure in doing things: 0  PHQ2 (Q2) Feeling down,  depressed or hopeless: 0  PHQ2 Score: 0  .  Abuse/Neglect (Q1) Feel unsafe in relationship: NO  Abuse/Neglect (Q2) Hurt in past year: NO  .  .  .  .  .  Data to be shared to Telemed/Office Visits   August 13, 2019 9:32 AM  Screening [place]: [place])  PHQ2 Score: 0  .  ---------- ---------- ----------   .  ......................................Marland KitchenDortha Schwalbe. [place]  August 13, 2019 9:32   AM  .  .  Patient Prep Updates   [facility] Pre-Visit Notes:  August 13, 2019 9:32 AM  Screening [place]: [place])  PHQ2 Score: 0  .  ---------- ---------- ----------   .  .  .  .  .  .  .  .  Chief Complaint:   annual exam  History of Present Illness:   48 year old pateint of Dr suzukida here today for an annual of exam  completed her covid vaccine 07/2019  .  Left Knee Pain    no recent symptoms    .  Patient feels well   She is here today with her daughter   .  .  .  Past Medical History:(reviewed)  KNEE OSTEOARTHRITIS  VITAMIN D DEFICIENCY  ALLERGIC CONJUNCTIVITIS  PTERYGIUM  Hx of HERNIA, UMBILICAL  Hx of OVARIAN CYST  .  HCM:  Tdap: 2011  Zoster: at age 64  Prevnar/Pneumovax: at age 58  HAV/HBV status:  Pap: Oct 2010: NIL/HPV-, May 2014: NIL/HPV-, June 2019:   Mammo: May 2018: wnl   Colonoscopy: at age 64  DEXA: at age 64  .  Family History: (reviewed)   M: healthy  F: healthy  Siblings: 8, healthy, one with surgery for valvulopathy  Children: 3, healthy   no hx of breast, ovarian, or colon ca in family, no hx heart disease or str  okes  .  Social History:   Works: PCA worked throught covid  Tobacco: never   EtOH: none   IVDU: none  Exercise: walks   Diet: balanced, about 2 servings calcium/day   Seatbelts/sunscreen: Y   Feels safe at home: Y, lives with family  Sexually active: Y  Contraception: vasectomy   Periods: regular  .  .  .  .  .  Marland Kitchen  Past Medical History (prior to today's visit):  SKIN TAGS (ICD-701.9) (ICD10-L91.8)  OSTEOARTHRITIS, KNEES (ICD-715.96) (ICD10-M17.9)  VITAMIN D DEFICIENCY (ICD-268.9) (ICD10-E55.9)  ALLERGIC  CONJUNCTIVITIS (ICD-372.14) (ICD10-H10.10)  PTERYGIUM (ICD-372.40) (ICD10-H11.009)  Hx of HERNIA, UMBILICAL (ICD-553.1) (ZOX09-U04.9)  Hx of OVARIAN CYST (ICD-620.2) (ICD10-N83.20)  ANNUAL EXAM - JUNE 2019 (ICD-V72.31) (ICD10-Z00.00)      SCREENING FOR MALIGNANT NEOPLASM, CERVIX (ICD-V76.2) (ICD10-Z12.4)  .  Past Medical History (changes today):  Added new problem of SCREENING CANCER, BREAST (ICD-V76.12) (ICD10-Z12.31)  Added new problem of ANNUAL EXAM (ICD-V70.0) (ICD10-Z00.00)  .  Marland Kitchen  No Changes to Medication List   Medications Reviewed:  Done  .  Marland Kitchen  No Known Allergies  .  .  .  .  .  Vitals:   Ht: 63.25 in.  Wt: 190.4 lbs.  BMI (in-lb) 33.58  Temp: 98.2deg F.     BP (InitialScreening): 131 / 83     BP (Rechecked, Actionable): 131 / 8  3 mmHg   Pulse Rate: 64 bpm Resp Rate: 18 bpm O2 Sat: 100 %  .  .  .  .  .  .  Orders (this visit):  Est Preventive 40-64yo [CPT-99396]  Medications Reviewed [CPT-1160F]  Mammo Screening [CPT-77057]  LYTES [SODIUM] [CPT-82495]  BUN [BUN] [CPT-84520]  Creatinine (CR) [CREATININE] [CPT-82565]  Glucose fasting (FBS) [BG FASTING] [CPT-82947]  Hemoglobin A1C (Glycohemoglobin) [HGBA1C] [CPT-83036]  Lipid Profile-Chol, HDL, Trig, LDL(calc) [CHOLESTEROL] [CPT-80061]  [facility] [Ref-Derm]  .  Patient Care Plan  .  Marland Kitchen  Immunization Worksheet 2019 (rev 06/17/2019)   .  .  .  .  .  .  .  .  .  .  .  .  .  Follow-up With:   .  .  Marland Kitchen  Electronically Signed by Theodis Aguas MSN,NP on 08/13/2019 at 6:3  6 PM  ________________________________________________________________________

## 2019-08-14 ENCOUNTER — Ambulatory Visit

## 2019-08-14 NOTE — Progress Notes (Signed)
 [facility]  [address]  [place] [address]  Main: [phone]  Fax: [phone]  Patient Portal: [email or web address]      August 17, 2019            MRN: [id number]            DOB: 03/27/1970   [name]   [address]   [place] [address]      The results of your recent tests are as follows:      Cholesterol Tests:      Total Cholesterol   186  Normal 110-199    08/13/2019    HDL (good cholesterol)  63  Normal >40     08/13/2019    Triglyceride    64  Normal 40-250    08/13/2019    LDL (bad cholesterol)   110  Normal <160    08/13/2019     Diabetes Tests:      Glucose Fasting:   93  Normal 70-100    08/13/2019    HgbA1c:    5.6  Normal 4.3 - 5.6    08/13/2019     Kidney Tests:      BUN:     12  Normal 6 - 24    08/13/2019    Creatinine:    0.75  Normal 0.57 - 1.30   08/13/2019    GFR (Non African-American):  95  Normal >= 60    08/13/2019    GFR (African-American):  110  Normal >= 60    08/13/2019     Electrolyte Tests:      Sodium:    139 MEQ/L  Normal 135-145   08/13/2019    Potassium:    3.9 MEQ/L  Normal 3.6-5.1   08/13/2019    Chloride:    105 MEQ/L  Normal 98-110   08/13/2019    Bicarbonate:    26 MEQ/L  Normal 20 - 30   08/13/2019    Anion Gap:    8   Normal 5 - 18    08/13/2019       Sincerely,       Darl Pikes L. [facility] MSN NP   [facility]  [phone]  Results Provided by: Mail           Created By Frederich Cha on 08/14/2019 at 12:07 PM    Electronically Signed By Chesley Noon MSN,NP on 08/17/2019 at 10:54 AM

## 2019-08-19 ENCOUNTER — Emergency Department
Admit: 2019-08-19 | Discharge: 2019-08-19 | Disposition: A | Discharge status: Home / Self Care | Payer: Commercial Managed Care - PPO

## 2019-08-19 ENCOUNTER — Ambulatory Visit

## 2019-08-19 ENCOUNTER — Ambulatory Visit: Admitting: Emergency Medicine

## 2019-08-19 ENCOUNTER — Emergency Department
Admit: 2019-08-19 | Disposition: A | Discharge status: Home / Self Care | Source: Home / Self Care | Attending: Emergency Medicine | Admitting: Emergency Medicine

## 2019-08-19 DIAGNOSIS — R402360 Coma scale, best motor response, obeys commands, unspecified time: Secondary | ICD-10-CM

## 2019-08-19 DIAGNOSIS — R402250 Coma scale, best verbal response, oriented, unspecified time: Secondary | ICD-10-CM

## 2019-08-19 DIAGNOSIS — K8020 Calculus of gallbladder without cholecystitis without obstruction: Principal | ICD-10-CM

## 2019-08-19 DIAGNOSIS — R402140 Coma scale, eyes open, spontaneous, unspecified time: Secondary | ICD-10-CM

## 2019-08-19 LAB — HX HEM-ROUTINE
HX BASO #: 0 10*3/uL (ref 0.0–0.2)
HX BASO: 0 %
HX EOSIN #: 0 10*3/uL (ref 0.0–0.5)
HX EOSIN: 0 %
HX HCT: 41.7 % (ref 32.0–45.0)
HX HGB: 13.4 g/dL (ref 11.0–15.0)
HX IMMATURE GRANULOCYTE#: 0 10*3/uL (ref 0.0–0.1)
HX IMMATURE GRANULOCYTE: 0 %
HX LYMPH #: 1.4 10*3/uL (ref 1.0–4.0)
HX LYMPH: 14 %
HX MCH: 27.9 pg (ref 26.0–34.0)
HX MCHC: 32.1 g/dL (ref 32.0–36.0)
HX MCV: 86.9 fL (ref 80.0–98.0)
HX MONO #: 0.6 10*3/uL (ref 0.2–0.8)
HX MONO: 5 %
HX MPV: 11.9 fL — ABNORMAL HIGH (ref 9.1–11.7)
HX NEUT #: 8.2 10*3/uL — ABNORMAL HIGH (ref 1.5–7.5)
HX NRBC #: 0 10*3/uL
HX NUCLEATED RBC: 0 %
HX PLT: 239 10*3/uL (ref 150–400)
HX RBC BLOOD COUNT: 4.8 M/uL (ref 3.70–5.00)
HX RDW: 12.5 % (ref 11.5–14.5)
HX SEG NEUT: 80 %
HX WBC: 10.2 10*3/uL (ref 4.0–11.0)

## 2019-08-19 LAB — HX CHEM-LFT
HX ALANINE AMINOTRANSFERASE (ALT/SGPT): 161 IU/L — ABNORMAL HIGH (ref 0–54)
HX ALKALINE PHOSPHATASE (ALK): 78 IU/L (ref 40–130)
HX ASPARTATE AMINOTRANFERASE (AST/SGOT): 90 IU/L — ABNORMAL HIGH (ref 10–42)
HX BILIRUBIN, DIRECT: 0.2 mg/dL (ref 0.0–0.5)
HX BILIRUBIN, TOTAL: 0.6 mg/dL (ref 0.2–1.1)

## 2019-08-19 LAB — HX CHEM-PANELS
HX ANION GAP: 7 (ref 3–14)
HX BLOOD UREA NITROGEN: 15 mg/dL (ref 6–24)
HX CHLORIDE (CL): 108 meq/L (ref 98–110)
HX CO2: 24 meq/L (ref 20–30)
HX CREATININE (CR): 0.75 mg/dL (ref 0.57–1.30)
HX GFR, AFRICAN AMERICAN: 110 mL/min/{1.73_m2}
HX GFR, NON-AFRICAN AMERICAN: 95 mL/min/{1.73_m2}
HX GLUCOSE: 97 mg/dL (ref 70–139)
HX POTASSIUM (K): 4.9 meq/L (ref 3.6–5.1)
HX SODIUM (NA): 139 meq/L (ref 135–145)

## 2019-08-19 LAB — HX DIABETES: HX GLUCOSE: 97 mg/dL (ref 70–139)

## 2019-08-19 LAB — HX CHEM-OTHER: HX LIPASE: 24 IU/L (ref 8–60)

## 2019-08-19 LAB — HX TRANSFUSION

## 2019-08-19 NOTE — ED Provider Notes (Signed)
 Marland Kitchen  Name: [name], Helling  MRN: [id number]  Age: 49 yrs  Sex: Female  DOB: June 12, 1970  Arrival Date: 08/19/2019  Arrival Time: 09:40  Account#: [id number]  .  Working Diagnosis: Calculus of gallbladder without cholecystitis without ob  struction  PCP: Suzukida, Valli Glance  .  HPI:  06/09  11:11 49 Yo F with history of hernia repair few years ago, presents   ad41        with squeezing right abdominal pain that started 2:00 am. It is        associated with nausea and vomiting. She denies fever, chills,        diarrhea, numbness, tingling. Her last bowel movement was        around 3:00 am. .  .  Historical:  - Allergies: No known drug Allergies;  - Home Meds: vitamins;  - PMHx: None;  - PSHx: Unable to Obtain; hernia surgery;  - Social history: Smoking status: The patient is not a current    smoker.  .  .  ROS:  11:15 Constitutional: Negative for chills, fever.                     ad41  11:15 Eyes: Negative for icterus.  11:15 Cardiovascular: Negative for chest pain, palpitations.  11:15 Respiratory: Negative for cough, Pleuritic pain shortness of        breath, wheezing.  11:15 Abdomen/GI: Positive for abdominal pain, nausea, vomiting,        Negative for diarrhea, constipation.  11:15 MS/extremity: Negative for erythema, rash.  11:15 Neuro: Negative for altered mental status, dizziness.  .  Vital Signs:  09:47 BP 140 / 74 Left Arm Sitting (auto/reg); Pulse 74 Monitor; Resp pn        16; Temp 36.2(TE); Pulse Ox 96% ; Pain 7/10;  13:39 BP 123 / 69; Pulse 74; Resp 16; Pulse Ox 100% on R/A; Pain 5/10;cc9  14:47 BP 132 / 66; Pulse 70; Resp 16; Pulse Ox 100% on R/A; Pain 5/10;cc9  .  Glasgow Coma Score:  09:47 Eye Response: spontaneous(4). Verbal Response: oriented(5).     pn        Motor Response: obeys commands(6). Total: 15.  10:32 Eye Response: spontaneous(4). Verbal Response: oriented(5).     cc9        Motor Response: obeys commands(6). Total: 15.  .  Exam:  11:16 Constitutional:  This is a well developed, well nourished        ad41  .  Name:Agent, [name]  [id number]  [id number]  Page 1 of 6  %%PAGE  .  Name: [name]  MRN: [id number]  Age: 38 yrs  Sex: Female  DOB: [birth year]  Arrival Date: 08/19/2019  Arrival Time: 09:40  Account#: [id number]  .  Working Diagnosis: Calculus of gallbladder without cholecystitis without ob  struction  PCP: Suzukida, Valli Glance  .        patient who is awake, alert, and in no acute distress.        Head/Face:  Normocephalic, atraumatic. Neck:  Supple, full        range of motion without nuchal rigidity Respiratory:  Lungs        have equal breath sounds bilaterally, clear to auscultation        Chest/axilla:  Normal chest wall appearance and motion.        Nontender with no deformity.  No lesions are appreciated.        Cardiovascular:  Regular  rate and rhythm with a normal S1 and        S2.  No gallops, murmurs, or rubs.  Abdomen/GI:  Soft, mild        tenderness in the upper abdomen.  No distension. No guarding or        rebound.  No evidence of tenderness throughout. Skin:  Warm,        dry with normal turgor.  .  Procedures:  13:31 A focused bedside ultrasound was performed in the emergency     ad3        department and interpreted by a credentialed physician. A        gallbladder ultrasound was performed in a with Abdominal pain        Vomiting Gallbladder was visualized in sagittal and transverve        views Gallstone(s) were identified No sonographic murphy's sign        was elicited no pericholecyctic fluid identified No gallbladder        wall thickening There was no common bile duct dilation        Impression Cholelithiasis No evidence of cholecystitis.  .  MDM:  14:05 ED course: 49 Yo F presents with upper abdominal pain that      ad41        started this early am, associated with nausea, vomiting. Her        labs are note to have abnormal transaminases, and the RUQ        ultrasound showed small gallstones, without inflamatory        changes. She is given toradol for pain with good  response, and        PO challenge, which appears to be tolerating well without        nausea..  14:41 Medical screening is not applicable.                            ad41  14:54 Resident chart complete and electronically signed: Lucia Bitter.ad41  .  06/09  10:55 Order name: ALT/SPGT (Alanine Aminotransferase); Complete Time: cc9        11:51  06/09  10:55 Order name: AST/SGOT (Aspartate Aminotranferase); Complete      cc9        Time: 11:51  06/09  10:55 Order name: Alk Phos (Alkaline Phosphatase); Complete Time:     cc9        11:51  .  Name:[name], Dellamae  [id number]  [id number]  Page 2 of 6  %%PAGE  .  Name: [name]  MRN: [id number]  Age: 67 yrs  Sex: Female  DOB: November 17, 1970  Arrival Date: 08/19/2019  Arrival Time: 09:40  Account#: [id number]  .  Working Diagnosis: Calculus of gallbladder without cholecystitis without ob  struction  PCP: Suzukida, Jillian  .  06/09  10:55 Order name: BUN (Blood Urea Nitrogen); Complete Time: 11:51     cc9  06/09  10:55 Order name: Bilirubin, Direct; Complete Time: 11:51             cc9  06/09  10:55 Order name: Bilirubin, Total; Complete Time: 11:51              cc9  06/09  10:55 Order name: CBC/Diff (With Plt); Complete Time: 11:51           cc9  06/09  10:55 Order name: CR (Creatinine); Complete Time: 11:51  cc9  06/09  10:55 Order name: GLU (Glucose); Complete Time: 11:51                 cc9  06/09  10:55 Order name: LYTES (Na, K, Cl, Co2); Complete Time: 11:51        cc9  06/09  10:55 Order name: Lipase; Complete Time: 11:51                        cc9  06/09  11:18 Order name: Blood Bank Hold; Complete Time: 11:51               dispa  t  06/09  11:23 Order name: GFR, AA; Complete Time: 11:51                       dispa  t  06/09  11:23 Order name: GFR, NAA; Complete Time: 11:51                      dispa  t  06/09  10:35 Order name: Urine Dip (POC); Complete Time: 11:08               cc9  06/09  10:35 Order name: Urine HCG (POC); Complete Time: 11:08                cc9  06/09  11:57 Order name: [place] Setup; Complete Time: 12:02                      ad3  06/09  12:40 Order name: Adult EKG (order using folder); Complete Time: 12:40ad41  06/09  12:40 Order name: EKG (order using folder); Complete Time: 12:49      ad41  06/09  12:46 Order name: PO Challenge; Complete Time: 13:07                  ad41  .  Dispensed Medications:  11:23 Drug: NS - Sodium Chloride 500 mL Route: IV; Rate: Bolus;       cc9  13:15 Drug: Toradol 15 mg {Note: abd pain 5/10.} Route: IVP;          jm60  .  Marland Kitchen  Radiology Orders:  Order Name: CT Abdomen/Pelvis; Last Status: Pending Cancel;  .  Name:Brillhart, Kinjal  [id number]  [id number]  Page 3 of 6  %%PAGE  .  Name: Ruqaya, [name]  MRN: [id number]  Age: 79 yrs  Sex: Female  DOB: [birth year]  Arrival Date: 08/19/2019  Arrival Time: 09:40  Account#: [id number]  .  Working Diagnosis: Calculus of gallbladder without cholecystitis without ob  struction  PCP: Suzukida, Valli Glance  .    Time: 08/19/19 11:50; By: Woodroe Chen; For: ad41; Order Method:    Electronic; Notes: Bed Name: A17  Attending Notes:  13:32 Attestation: Assessment and care plan reviewed with             ad3        resident/midlevel provider. See their note for details.        Resident's history reviewed, patient interviewed and examined.        Attending HPI: HPI: 49 year old female with no past medical        history [place] speaking only and gives a history through an        interpreter woke up at 2 AM this morning with severe abdominal        pain. The pain was waxing  and waning this morning the pain has        persisted mostly in the upper part of her abdomen bilaterally.        The pain is localized to the anterior abdomen and does not        radiate to the back. Patient reports multiple episodes of        nonbilious nonbloody vomiting no diarrhea no fever or chills.        She states that she has had similar pain in the past but the        pain typically subsides but this 1 has persisted.  No chest pain        or shortness of breath no cough no fever or chills currently        reports the pain is 6 out of 10. Attending ROS Constitutional:        no fever malaise or weight loss Eyes: no eye discharge no        redness ENT: no sorethroat Neck: no masses no pain        Cardiovascular: no chest pain no palpations Respiratory: no SOB        no cough Back: no back pain or recent injury GU: no dysuria or        frequency MS/Extremity: no pain or injury Skin: No skin rash        Neuro: no numbness no weakness. Attending Exam: My personal        exam reveals Nontoxic appearing son at bedside head is        normocephalic atraumatic pupils are equal and reactive to light        extraocular movements are intact neck is supple no cervical        lymphadenopathy no conjunctival icterus lungs are clear to        auscultation bilaterally CVS RRR normal S1-S2 no murmurs        abdomen is obese soft mild right upper quadrant tenderness with        an unequivocal Murphy sign no CVAT no pedal edema calf        tenderness or asymmetry skin is dry there is brisk capillary        refill with no rash. I have reviewed Nurses Notes. ED Course:        49 year old female with abdominal pain that started 2 AM        patient reports that the pain is not related to food intake has        had similar pain in the past but this pain has persisted.        Patient has had no fevers and has had some nausea and vomiting        reports that that has now resolved. On my exam patient has some        mild right upper quadrant tenderness. An EKG obtained was        visualized by me and interpreted as a normal sinus rhythm with        a ventricular rate of 60. There are T wave inversions in the        anterior precordial leads however when compared to the  .  Name:[name], Kaytlan  [id number]  [id number]  Page 4 of 6  %%PAGE  .  Name: Alyx, [name]  MRN: [id number]  Age: 51 yrs  Sex: Female  DOB: Jan 06, 1971  Arrival Date:  08/19/2019  Arrival  Time: 09:40  Account#: [id number]  .  Working Diagnosis: Calculus of gallbladder without cholecystitis without ob  struction  PCP: Suzukida, Valli Glance  .        patient's EKG from 2017 these are not new. An IV was initiated        patient received IV Toradol for pain with improvement of her        symptoms. Laboratory data including CBC and electrolytes        obtained reveals no leukocytosis. There is mild transaminitis        but the patient's alkaline phosphatase as well as total        bilirubin are normal. A bedside ultrasound obtained reveals        multiple small biliary calculi with a normal common bile duct.        There is no pericholecystic fluid and the gallbladder wall is        within normal limits. Patient was given a by mouth challenge        which he tolerated well with no further episodes of emesis. She        was counseled on dietary modification for her gallstones and        given referral to general surgery. My Working Impression:        Cholelithiasis. Attending chart complete and electronically        signed: Doran Stabler, MS MD.  .  Disposition Summary:  08/19/19 14:41  Discharge Ordered        Location: Home -                                                ad41        Problem: new                                                    ad41        Symptoms: have improved                                         ad41        Condition: Stable                                               ad41        Diagnosis          - Calculus of gallbladder without cholecystitis without       ad41        obstruction        Followup:                                                       ad41          - With: Private Physician          -  When: 2 days          - Reason: Continuance of care        Discharge Instructions:          - Discharge Summary Sheet                                     ad41          - BILIARY COLIC w/Gallstone (cnfrmd)                          ad41        Forms:          -  Medication Reconciliation Form                              ad41          - Fax Summary                                                 ad41  Signatures:  Dispatcher, [employer]                          dispa  Rodney Booze, Elease Hashimoto                 RN   pn  Renold Genta                           MD   Havery Moros                       BSN  cc9  Felicita Gage                      BSN  [id number]  August Saucer, Arlind                           MD   ad41  .  Name:Sardinha, [name]  [id number]  [id number]  Page 5 of 6  %%PAGE  .  Name: Shyniece, [name]  MRN: [id number]  Age: 44 yrs  Sex: Female  DOB: 1970/07/05  Arrival Date: 08/19/2019  Arrival Time: 09:40  Account#: [id number]  .  Working Diagnosis: Calculus of gallbladder without cholecystitis without ob  struction  PCP: Suzukida, Valli Glance  .  Marland Kitchen  Corrections: (The following items were deleted from the chart)  11:15 11:11 49 Yo F with history of hernia repair few years ago,      ad41        presents with squeezing right abdominal pain that started 2:00        am. It is associated with nausea and vomiting. She denies        fever, chills, diarrhea, numbness, tingling. Marland Kitchen ad41  11:49 11:16 Constitutional: This is a well developed, well nourished  ad41        patient who is awake, alert, and in no acute distress.        Head/Face: Normocephalic, atraumatic. Neck: Supple, full range  of motion without nuchal rigidity Respiratory: Lungs have equal        breath sounds bilaterally, clear to auscultation Chest/axilla:        Normal chest wall appearance and motion. Nontender with no        deformity. No lesions are appreciated. Cardiovascular: Regular        rate and rhythm with a normal S1 and S2. No gallops, murmurs,        or rubs. Abdomen/GI: Soft, mild tenderness in the right upper        and lower abdomen. No distension. No guarding or rebound. No        evidence of tenderness throughout. Skin: Warm, dry with normal        turgor. ad41  .  Document is preliminary  until electronically or manually signed by the atte  nding physician  .  .  .  .  .  .  .  .  .  .  .  .  .  .  .  .  .  .  .  .  .  .  .  Name:Reyez, Evan  [id number]  [id number]  Page 6 of 6  .  %%END

## 2019-08-19 NOTE — ED Provider Notes (Signed)
 Marland Kitchen  Name: [name] [name]  MRN: [id number]  Age: 49 yrs  Sex: Female  DOB: May 23, 1970  Arrival Date: 08/19/2019  Arrival Time: 09:40  Account#: [id number]  Bed A17  PCP: Ailene Ards  Chief Complaint: Abd Pain  .  Presentation:  06/09  09:51 Presenting complaint: Presenting complaint: Patient states:     pn        abdominal pain since 2 AM while sleeping got heavier at 7:30        AM, mother speaks [place], had bowel movement this morning        normal denies blood, denies diarrhea, pain got worse after        bowel movement.  09:58 Presenting complaint: Patient states: denies chest pain.        pn  09:58 Method Of Arrival: Walk In                                      pn  10:04 Presenting complaint: Patient states: will need [place]    pn        [place] interpreter.  10:05 Acuity: Adult 3                                                 mt18  .  Historical:  - Allergies:  10:00 No known drug Allergies;                                        pn  - Home Meds:  10:00 vitamins [Active];                                              pn  - PMHx:  10:00 None;                                                           pn  - PSHx:  10:00 Unable to Obtain;                                               pn  10:03 hernia surgery;                                                 pn  .  - Social history: Smoking status: The patient is not a current    smoker.  .  .  Screening:  10:02 Fall Risk None identified. Exposure Risk/Travel Screening:      pn        COVID 19 Vaccinequestion Yes.  10:03 SEPSIS SCREENING - Temp > 38.3 or <  36.0 No - Heart Rate > 90   pn        No - Respiratory > 20 No - SBP < 90 No Does this patient have a        suspected source of infection at this timequestion No SIRS Criteria (>        = 2) No. Safety screen: Unable to do safety screening unable to        obtain family with patient. Suicide (ED Safe) Screening: .        Unable to perform ED safe: family with patient.  .  Vital Signs:  09:47 BP 140 / 74  Left Arm Sitting (auto/reg); Pulse 74 Monitor; Resp pn        16; Temp 36.2(TE); Pulse Ox 96% ; Pain 7/10;  13:39 BP 123 / 69; Pulse 74; Resp 16; Pulse Ox 100% on R/A; Pain 5/10;cc9  .  Name:[name] [name]  [id number]  [id number]  Page 1 of 4  %%PAGE  .  Name: [name], [name]  MRN: [id number]  Age: 58 yrs  Sex: Female  DOB: April 10, 1970  Arrival Date: 08/19/2019  Arrival Time: 09:40  Account#: [id number]  Bed A17  PCP: Ailene Ards  Chief Complaint: Abd Pain  .  14:47 BP 132 / 66; Pulse 70; Resp 16; Pulse Ox 100% on R/A; Pain 5/10;cc9  .  Glasgow Coma Score:  09:47 Eye Response: spontaneous(4). Verbal Response: oriented(5).     pn        Motor Response: obeys commands(6). Total: 15.  10:32 Eye Response: spontaneous(4). Verbal Response: oriented(5).     cc9        Motor Response: obeys commands(6). Total: 15.  .  Triage Assessment:  10:02 General: Appears in no apparent distress, Behavior is           pn        cooperative. Pain: Complains of pain in abdomen Pain currently        is.  .  Assessment:  10:32 General: Appears in no apparent distress, Behavior is           cc9        cooperative, pleasant, Pt Cre[place] speaking - family at bedside        assisting with translation. Pt states she developed 02:00 -        worse this morning at 07:30. Pt states she vomited once. Denies        fever. Denies urinary complaints. . Pain: Complains of pain in        abdomen Pain currently is 7/10. Neuro: No deficits noted.        Cardiovascular: Denies Chest pain. Respiratory: Airway is        patent Respiratory effort is even, unlabored, Respiratory        pattern is regular, symmetrical, Denies shortness of breath.        GI: Abdomen is non- distended Abd is soft X 4 quads. GI:        Reports nausea, Pain is 7 out of 10 on a pain scale. vomiting.        GU: Denies burning with urination, urinary frequency, urgency.        Skin: Skin is normal. Musculoskeletal: No deficits noted.  12:41 General: Dr Wayne Sever at bedside for  bedside ultrasound .         cc9  .  Observations:  09:40 Patient arrived in ED.  jl33  10:05 Triage Completed.                                               mt18  11:02 Patient Visited By: Crawford Givens  .  Procedure:  10:56 Labs drawn. Sent per order to lab. Inserted peripheral IV: 20   cc9        gauge in left Wrist.  11:00 ALT/SPGT (Alanine Aminotransferase) Sent.                       cc9  11:00 AST/SGOT (Aspartate Aminotranferase) Sent.                      cc9  11:00 Alk Phos (Alkaline Phosphatase) Sent.                           cc9  11:01 BUN (Blood Urea Nitrogen) Sent.                                 cc9  11:01 Bilirubin, Direct Sent.                                         cc9  .  Name:[name] [name]  [id number]  [id number]  Page 2 of 4  %%PAGE  .  Name: [name], [name]  MRN: [id number]  Age: 63 yrs  Sex: Female  DOB: 1970-03-20  Arrival Date: 08/19/2019  Arrival Time: 09:40  Account#: [id number]  Bed A17  PCP: Ailene Ards  Chief Complaint: Abd Pain  .  11:01 Bilirubin, Total Sent.                                          cc9  11:01 CBC/Diff (With Plt) Sent.                                       cc9  11:01 CR (Creatinine) Sent.                                           cc9  11:01 GLU (Glucose) Sent.                                             cc9  11:01 LYTES (Na, K, Cl, Co2) Sent.                                    cc9  11:01 Lipase Sent.  cc9  12:49 EKG done. (by ED staff). Old EKG Obtained Reviewed By: Joya Gaskins MD.  14:47 Discontinued IV bleeding controlled, pressure dressing applied, cc9        No redness/swelling at site.  .  Dispensed Medications:  11:23 Drug: NS - Sodium Chloride 500 mL Route: IV; Rate: Bolus;       cc9  13:15 Drug: Toradol 15 mg {Note: abd pain 5/10.} Route: IVP;          jm60  .  Marland Kitchen  Interventions:  10:05 Demo Sheet Scanned into  Chart                                   py2  10:31 Placed in gown Call light in reach Bed in low position Side     cc9        rail up X1. Verbal reassurance given. Warm blanket given.        Pillow given.  11:07 Urine DipResults: Specific Gravity 1.025 Ph- 5 Leukocytes +     em22        Nitrites- Negative. Protein ++ Glucose- Normal. Ketones        Negative. Urobilinogen Negative Bilirubin Negative Blood-        Negative.  11:08 POC Test Urine HCG Negative RN notified of POC results.         em22  11:52 Demo Sheet Scanned into Chart                                   mm12  13:02 Ultrasound Images Scanned into Chart                            mm12  13:07 Diet: Patient given snack. Patient given ice chips. Patient     rc11        given juice. Tolerated well.  14:36 ECG/EKG Scanned into Chart                                      mm12  .  Outcome:  14:41 Discharge ordered by MD.                                        ad41  14:46 Discharged to home ambulatory, with family. Condition: stable.  cc9        Discharge instructions given to patient, family, Instructed on:        discharge instructions, follow up and referral plans.        Demonstrated understanding of instructions. Discharge        Assessment: Patient awake, alert and oriented x 3. No cognitive        and/or functional deficits noted. Patient verbalized        understanding of disposition instructions. Chart Status Nursing        note complete and electronically signed.  .  Name:[name] [name]  [id number]  [id number]  Page 3 of 4  %%PAGE  .  Name: [name], [name]  MRN: [id number]  Age: 21 yrs  Sex: Female  DOB: 03/27/1970  Arrival Date: 08/19/2019  Arrival Time: 09:40  Account#: [id number]  Bed A17  PCP: Ailene Ards  Chief Complaint: Abd Pain  .  15:02 Patient left the ED.                                            cc9  .  Corrections: (The following items were deleted from the chart)  09:51 09:47 BP 140 / 74 Sitting Auto L Arm Regular; Pulse 16bpm;       pn        MonitorResp 16bpm; Pulse Ox 96%; Temp 36.2C Temporal; Pain        7/10; jl33  10:00 09:51 Presenting complaint: pn                                  pn  .  Signatures:  Normandin DNP, Omer Jack, Melissa                             mm12  Joyce Gross                       BSN  cc9  Octavia Heir                         RN   mt18  de Eden Lathe                       CCT  jl33  Felicita Gage                      BSN  [id number]  Sudie Bailey                                rc11  [place], Irving Burton                         CCT  Abbey Chatters, Arlind                           MD   ad41  Coletta Memos                          Sec  py2  .  .  .  .  .  .  .  .  .  .  .  .  .  .  .  .  .  .  .  .  .  .  .  .  Name:[name] [name]  [id number]  [id number]  Page 4 of 4  .  %%END

## 2019-08-19 NOTE — ED Provider Notes (Signed)
 Your patient [name] was seen in the emergency room on 08/19/2019      Created By  [employer] on 08/19/2019 at 09:41 AM    Electronically Signed By Ailene Ards MD on 08/19/2019 at 10:30 AM

## 2019-08-20 ENCOUNTER — Ambulatory Visit

## 2019-08-20 NOTE — Telephone Encounter (Signed)
Call Details:   Patient PCP = [name] Ards, MD  [name]    [facility]) called on August 20, 2019 2:56 PM.  Message taken by: Raeanne Gathers  Primary call-back number: [phone]    Secondary call-back number: () -    Call Reason(s): Message/Call-Back      ** MESSAGE / CALL-BACK.  Regarding: Pt needs to be seen. Was in the ER on 6/9. Was told that she needs surgery. No appt available for PCP/pa/np    Please schedule and call daughter with date and time     ---------- ---------- ---------- ---------- ---------- ----------       RESPONSE/ORDERS:    Spoke w. daughter [name], sch DC TL FU for 06/16 at 02:00pm as this was the 1st available appt. Ty   ......................................Marland KitchenMarshall Cork  August 21, 2019 8:27 AM    Spoke w. [name], r's DC TL for 06/16 at 130pm as Dr. Nicola Girt has a meeting at 02pm. 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

## 2019-08-26 ENCOUNTER — Ambulatory Visit: Admit: 2019-08-26 | Payer: Commercial Managed Care - PPO

## 2019-08-26 ENCOUNTER — Ambulatory Visit

## 2019-08-26 DIAGNOSIS — R1011 Right upper quadrant pain: Principal | ICD-10-CM

## 2019-08-26 MED ORDER — OMEPRAZOLE: 1 | 30 | 0 refills | 0 days | Status: AC

## 2019-08-26 NOTE — Telephone Encounter (Signed)
 Call Details:   Patient PCP = Ailene Ards, MD  [name] (Patient) called on August 26, 2019 11:03 AM.  Message taken by: Bartholomew Crews  Primary call-back number: [phone]    Secondary call-back number: () -    Call Reason(s): Message/Call-Back      ** MESSAGE / CALL-BACK.  Regarding: please call the number in the note for TL visit today. 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

## 2019-08-26 NOTE — Progress Notes (Signed)
 General Medicine Tele-Medicine Visit  This visit is done remotely with myself andthis patient.  Patient presents during the COVID-19 pandemic / federally declared state of   public health emergency.  Visit Type: Video Visit  Video Visit Vendor [employer]  Patient consent for audio or video type visit:  Yes  Chief Complaint:   Fu abdominal pain   History of Present Illness:   Accompanied by sister who translates. Declines cape verdean interpreter  .  Seen in ED with abdominal pain. Gallstones seen on bedside ultrasound, was   given referral for surgery.   .  Currently feeling a little sore. Abdominal pain had come and gone for coupl  e of months. Usually resolves on own. Sometimes hurts in the morning before   eating, not necessarily tied to eating. When seen in ED had happened overn  ight.   Pain is on the right side, upper part. Not burning. Does get worse when lay  s down when it is active. Travels to whole belly. No nausea/vomiting. No di  arrhea/constipation, black or bloody stool recently. No fevers. No urinary   symptoms, pelvic pain or vaginal discharge.   .  Surgery appt was not made. Patient and sister are wondering if surgery is n  Radio broadcast assistant   .  .  .  .  .  .  .  .  .  .  .  .  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  Past Medical History (changes today):  Added new problem of ABDOMINAL PAIN, RIGHT UPPER QUADRANT (ICD-789.01) (ICD  10-R10.11)  Problems  Reviewed:  Done  .  Marland Kitchen  Medications (after today's visit):  OMEPRAZOLE 20 MG ORAL CAPSULE DELAYED RELEASE (OMEPRAZOLE) Take one capsule   by mouth once daily on an empty stomach; Route: ORAL  .  Medications Reviewed:  Done  .  Marland Kitchen  No Known Allergies  Allergies Reviewed:  Done  .  Marland Kitchen  Theodoro Kos from Patient:   .  .  .  .  .  Additional PE:   no acute distress  resp effort wnl  a /T/ o x 4  Assessment /T/ Plan:   49 yo woman seen to follow up ED visit for abdominal pain, likely biliary c  olic.   Will schedule formal abdominal ultrasound as only have report of bedside ul  trasound  Discussed biliary colic, and that surgery generally only definitive treatme  nt. Will refer to surgery to further discuss depending on abdominal ultraso  und read. Reviewed that if nausea/vomiting, fevers, severe abdominal pain,   she needs to go to the ED to be evaluated.   Trial of omeprazole x one month  .  Call [phone] to schedule ultrasound   .  .  I spent 30 minutes with the patient counseling and treating the above condi  tions.  Location of Provider:  Primary Care Office  Location of Patient:  Home  Names of Participants for Visit: Elener Custodio, patient's sister, Janne Lab, MD  .  Orders (  this visit):  U/S Abdomen Specific Area [CPT-76705]  Est Level 4 (MDM or 30-39 min) [CPT-99214]  .  .  .  Patient Care Plan  .  Marland Kitchen  Immunization Worksheet 2019 (rev 06/17/2019)   .  .  .  .  .  .  .  .  .  .  .  .  .  Follow-up With:   .  .  Marland Kitchen  Electronically Signed by Ailene Ards, MD on 08/26/2019 at 9:44 PM  ________________________________________________________________________

## 2019-08-26 NOTE — Telephone Encounter (Signed)
 General Medicine Tele-Medicine Visit  This visit is done remotely with myself and this patient.  Patient presents during the COVID-19 pandemic / federally declared state of public health emergency.  Visit Type: Video Visit  Video Visit Vendor [employer]imity  Patient consent for audio or video type visit:  Yes  Chief Complaint:   Fu abdominal pain   History of Present Illness:   Accompanied by sister who translates. Declines cape verdean interpreter    Seen in ED with abdominal pain. Gallstones seen on bedside ultrasound, was given referral for surgery.     Currently feeling a little sore. Abdominal pain had come and gone for couple of months. Usually resolves on own. Sometimes hurts in the morning before eating, not necessarily tied to eating. When seen in ED had happened overnight.   Pain is on the right side, upper part. Not burning. Does get worse when lays down when it is active. Travels to whole belly. No nausea/vomiting. No diarrhea/constipation, black or bloody stool recently. No fevers. No urinary symptoms, pelvic pain or vaginal discharge.     Surgery appt was not made. 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) (ICD10-M17.9)  VITAMIN D DEFICIENCY (ICD-268.9) (ICD10-E55.9)  ALLERGIC CONJUNCTIVITIS (ICD-372.14) (ICD10-H10.10)  PTERYGIUM (ICD-372.40) (ICD10-H11.009)  Hx of HERNIA, UMBILICAL (ICD-553.1) (WCB76-E83.9)  Hx of OVARIAN CYST (ICD-620.2) (ICD10-N83.20)  ANNUAL EXAM - JUNE 2019 (ICD-V72.31) (ICD10-Z00.00)      SCREENING FOR MALIGNANT NEOPLASM, CERVIX (ICD-V76.2) (TDV76-H60.4)    Past Medical History (changes today):  Added new problem of ABDOMINAL PAIN, RIGHT UPPER QUADRANT (ICD-789.01) (ICD10-R10.11)     Problems Reviewed:  Done      Medications (after  today's visit):  OMEPRAZOLE 20 MG ORAL CAPSULE DELAYED RELEASE (OMEPRAZOLE) Take one capsule by mouth once daily on an empty stomach; Route: ORAL       Medications Reviewed:  Done      No Known Allergies  Allergies Reviewed:  Done          Vitals from Patient:             Additional PE:   no acute distress  resp effort wnl  a & o x 4  Assessment & Plan:   49 yo woman seen to follow up ED visit for abdominal pain, likely biliary colic.   Will schedule formal abdominal ultrasound as only have report of bedside ultrasound  Discussed biliary colic, and that surgery generally only definitive treatment. Will refer to surgery to further discuss depending on abdominal ultrasound read. Reviewed that if nausea/vomiting, fevers, severe abdominal pain, she needs to go to the ED to be evaluated.   Trial of omeprazole x one month    Call [phone] to schedule ultrasound       I spent 30 minutes with the patient counseling and treating the above conditions.  Location of Provider:  Primary Care Office  Location of Patient:  Home  Names of Participants for Visit: [name], patient's sister, Ailene Ards, MD    Orders (this visit):  U/S Abdomen Specific Area [CPT-76705]  Est Level 4 (MDM or 30-39 min) [WNI-62703]        Patient Care Plan  Immunization Worksheet 2019 (rev 06/17/2019)                             Follow-up With:           Created By Ailene Ards MD on 08/26/2019 at 01:31 PM    Electronically Signed By Ailene Ards MD on 08/26/2019 at 09:44 PM

## 2019-08-27 ENCOUNTER — Ambulatory Visit

## 2019-08-27 NOTE — Progress Notes (Signed)
 [facility]  [address]  [place] [address]  Main: [phone]  Fax: [phone]  Patient Portal: [email or web address]        August 27, 2019            MRN: [id number]            DOB: [birth year]   [name]   [address]   [place] [address]      I am writing to let you know that the following appointments have been made for you.       Ultrasound  Floating 4  Your appointment is scheduled for 09/29/2019 at 10:00am  If you need to make any changes, please call [phone]            Sincerely,         Marshall Cork  [facility]          Created By Marshall Cork on 08/27/2019 at 08:30 AM    Electronically Signed By Marshall Cork on 08/27/2019 at 08:34 AM

## 2019-08-27 NOTE — Telephone Encounter (Signed)
 Call Details:   Patient PCP = Ailene Ards, MD  called on August 27, 2019 8:38 AM.  Message taken by: Marshall Cork  Primary call-back number: () -    Secondary call-back number: () -    Call Reason(s):       ---------- ---------- ---------- ---------- ---------- ----------       RESPONSE/ORDERS:    Spoke w. pt, confirmed [place] appt. 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)  VITAMIN D DEFICIENCY (ICD-268.9) (ICD10-E55.9)  ALLERGIC CONJUNCTIVITIS (ICD-372.14) (ICD10-H10.10)  PTERYGIUM (ICD-372.40) (ICD10-H11.009)  Hx of HERNIA, UMBILICAL (ICD-553.1) (AEW25-R49.9)  Hx of OVARIAN CYST (ICD-620.2) (ICD10-N83.20)  ANNUAL EXAM - JUNE 2019 (ICD-V72.31) (ICD10-Z00.00)      SCREENING FOR MALIGNANT NEOPLASM, CERVIX (ICD-V76.2) (TXL21-V47.4)    Meds (prior to this call):   OMEPRAZOLE 20 MG ORAL CAPSULE DELAYED RELEASE (OMEPRAZOLE) Take one capsule by mouth once daily on an empty stomach; Route: ORAL            Created By Marshall Cork on 08/27/2019 at 08:38 AM    Electronically Signed By Marshall Cork on 08/27/2019 at 08:38 AM

## 2019-09-17 ENCOUNTER — Ambulatory Visit

## 2019-09-17 MED ORDER — OMEPRAZOLE: 1 | 30 | 6 refills | 0 days | Status: AC

## 2019-09-29 ENCOUNTER — Ambulatory Visit: Admit: 2019-09-29 | Payer: Commercial Managed Care - PPO

## 2019-09-29 ENCOUNTER — Ambulatory Visit

## 2019-09-29 DIAGNOSIS — R1011 Right upper quadrant pain: Principal | ICD-10-CM

## 2019-09-30 ENCOUNTER — Ambulatory Visit

## 2019-09-30 NOTE — Telephone Encounter (Signed)
 Pt returning call. Please call back the [phone] # . ......................................Marland KitchenPayton Doughty  September 30, 2019 12:27 PM          Call Details:   Attempted sister (we had spoken for televisit) at [phone] to review results of ultrasound. LMOM asking for call back   .......................................Ailene Ards, MD  September 30, 2019 11:17 AM       RESPONSE/ORDERS:    Sherron Monday with daughter re: ultrasound. Mother is no longer having abdominal pain, no discomfort after eating.   Does not want to see surgery. We discussed that if gallstones are not causing sx they do not necessarily be removed, but if abdominal discomfort returns should see surgery. If fevers, severe abd pain, nausea/vomiting, should go to ED   .......................................Ailene Ards, MD  September 30, 2019 4:19 PM           FINDINGS:      Pancreas: Visualized pancreas is unremarkable.      Liver: Liver has a normal echotexture. No focal solid mass or dilated intrahepatic ducts. There is normal hepatopedal venous flow in the main portal vein      Gallbladder: Gallbladder is physiologically distended with multiple mobile gallstones. There is no Murphy's sign or cortical ischemic fluid or gallbladder wall thickening.      Proximal CBD measures 3.4 mm.      Right kidney measures 9.8 cm and is normal..      IMPRESSION:   Cholelithiasis without ultrasound findings of acute cholecystitis.          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)  VITAMIN D DEFICIENCY (ICD-268.9) (ICD10-E55.9)  ALLERGIC CONJUNCTIVITIS (ICD-372.14) (ICD10-H10.10)  PTERYGIUM (ICD-372.40) (ICD10-H11.009)  Hx of HERNIA, UMBILICAL (ICD-553.1) (OAC16-S06.9)  Hx of OVARIAN CYST (ICD-620.2) (ICD10-N83.20)  ANNUAL EXAM -  JUNE 2019 (ICD-V72.31) (ICD10-Z00.00)      SCREENING FOR MALIGNANT NEOPLASM, CERVIX (ICD-V76.2) (TKZ60-F09.4)    Meds (prior to this call):   OMEPRAZOLE DR 20 MG CAPSULE (OMEPRAZOLE) TAKE ONE CAPSULE BY MOUTH ONCE DAILY ON AN EMPTY STOMACH            Created By Ailene Ards MD on 09/30/2019 at 11:16 AM    Electronically Signed By Ailene Ards MD on 09/30/2019 at 04:20 PM

## 2019-10-05 ENCOUNTER — Ambulatory Visit

## 2019-10-05 NOTE — Telephone Encounter (Signed)
 Call Details:   Patient PCP = Ailene Ards, MD  [name] (Patient) called on October 05, 2019 10:53 AM.  Message taken by: Bartholomew Crews  Primary call-back number: [phone]    Secondary call-back number: () -    Call Reason(s): Message/Call-Back      ** MESSAGE / CALL-BACK.  Regarding: pt would like to speak to coord. to reschedule mammogram appt. Please contact the pt     ---------- ---------- ---------- ---------- ---------- ----------       RESPONSE/ORDERS:      All set. 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-M17.9)  VITAMIN D DEFICIENCY (ICD-268.9) (ICD10-E55.9)  ALLERGIC CONJUNCTIVITIS (ICD-372.14) (ICD10-H10.10)  PTERYGIUM (ICD-372.40) (ICD10-H11.009)  Hx of HERNIA, UMBILICAL (ICD-553.1) (TVI71-G52.9)  Hx of OVARIAN CYST (ICD-620.2) (ICD10-N83.20)  ANNUAL EXAM - JUNE 2019 (ICD-V72.31) (ICD10-Z00.00)      SCREENING FOR MALIGNANT NEOPLASM, CERVIX (ICD-V76.2) (VHS92-T09.4)    Meds (prior to this call):   OMEPRAZOLE DR 20 MG CAPSULE (OMEPRAZOLE) TAKE ONE CAPSULE BY MOUTH ONCE DAILY ON AN EMPTY STOMACH            Created By Bartholomew Crews on 10/05/2019 at 10:53 AM    Electronically Signed By Marshall Cork on 10/05/2019 at 10:59 AM

## 2019-10-07 ENCOUNTER — Ambulatory Visit: Admitting: Dermatology

## 2019-11-12 ENCOUNTER — Ambulatory Visit: Admit: 2019-11-12 | Payer: Commercial Managed Care - PPO

## 2019-11-12 ENCOUNTER — Ambulatory Visit

## 2019-11-12 DIAGNOSIS — Z1231 Encounter for screening mammogram for malignant neoplasm of breast: Principal | ICD-10-CM

## 2019-11-18 ENCOUNTER — Ambulatory Visit

## 2019-11-18 NOTE — Progress Notes (Signed)
 [facility]  [address]  [place] [address]  Main: [phone]  Fax: [phone]  Patient Portal: [email or web address]      November 18, 2019            MRN: [id number]            DOB: 07/13/1970   [name]   [address]   [place] [address]      The results of your recent tests are as follows:      Mammogram IMPRESSION:   No mammography evidence for malignancy.  No significant interval change. The false negative rate of mammography is greater than 10%.  As long as patient's physical examination remains normal, annual screening mammogram is recommended.        Sincerely,       Idamae Lusher PA  [facility]  [phone]  Results Provided by: Mail           Created By Idamae Lusher PA on 11/18/2019 at 03:43 PM    Electronically Signed By Idamae Lusher PA on 11/18/2019 at 03:43 PM

## 2019-12-01 ENCOUNTER — Ambulatory Visit: Admit: 2019-12-01 | Payer: Commercial Managed Care - PPO

## 2019-12-01 ENCOUNTER — Ambulatory Visit: Admitting: Dermatology

## 2019-12-01 ENCOUNTER — Ambulatory Visit

## 2019-12-01 ENCOUNTER — Ambulatory Visit (HOSPITAL_BASED_OUTPATIENT_CLINIC_OR_DEPARTMENT_OTHER): Admitting: Student in an Organized Health Care Education/Training Program

## 2019-12-01 DIAGNOSIS — L814 Other melanin hyperpigmentation: Principal | ICD-10-CM

## 2019-12-01 DIAGNOSIS — L821 Other seborrheic keratosis: Secondary | ICD-10-CM

## 2019-12-01 DIAGNOSIS — D229 Melanocytic nevi, unspecified: Secondary | ICD-10-CM

## 2019-12-01 DIAGNOSIS — L813 Cafe au lait spots: Secondary | ICD-10-CM

## 2020-03-12 HISTORY — PX: INCISIONAL HERNIA REPAIR: SHX193

## 2020-06-07 NOTE — Progress Notes (Signed)
* * *        **[name], [name]**    --- ---    50 Y old Female, DOB: 02/14/1971, External MRN: [id number]    Account Number: [id number]    [address], [place], [place]    Home: [phone]    Guarantor: [employer] Insurance: [employer] PPO Payer ID: PAPER    PCP: Grayling Congress, MD Referring: Grayling Congress, MD External  Visit ID: [id number]    [facility]oint[facility]        * * *    01/26/2016  Progress Notes: Baron Hamper, MD **CHN#:** [id number]    --- ---    ---        Current Medications    ---    Taking     * Vitamin D     ---    Discontinued    * Compression Stockings 30-40 mmHg Thigh High - as directed ICD10: I83.893 [employer] PPO: [id number] daily    ---    * Multi For Her Tablet Orally     ---    * Medication List reviewed and reconciled with the patient    ---      Past Medical History    ---       Varicose veins.        ---      Surgical History    ---      Umbilical Hernia repair    ---      Family History    ---      Mother: alive    ---    Father: alive    ---      Social History    ---    Tobacco history: Never smoked.    Alcohol  Denies.      Allergies    ---      N.K.D.A.    ---    Forrestine Him Verified]      Hospitalization/Major Diagnostic Procedure    ---      No Hospitalization History.    ---      Review of Systems    ---     _ORT_ :    Eyes No. Ear, Nose Throat No. Digestion, Stomach, Bowel No. Bladder Problems  No. Bleeding Problems No. Numbness/Tingling No. Anxiety/Depression No.  Fever/Chills/Fatigue No. Chest Pain/Tightness/Palpitations No. Skin Rash No.  Dental Problems No. Joint/Muscle Pain/Cramps Yes. Blackout/Fainting No. Other  No.            Reason for Appointment    ---      1\. LEFT KNEE PAIN    ---      History of Present Illness    ---     _GENERAL_ :    50 year old cape verdian speaking woman presents with her adult daughter  acting as an interpreter as a new patient with complaints of long standing  left knee pain. She  describes 5 years of insidious onset, waxing/waning left  knee pain. Localizes to anteriolateral and posteromedial knee however is deep.  Is worsened with activity incl stairs, walking, standing for long periods of  time, she is now unable to kneel due to the knee pain. She states it is  improved with laying down. Denies hx of trauma. Occasionally has left  hip/groin pain but not her primary complaint. + mechanical symptoms with  buckling, catching and locking described. No numbness/tingling, no fevers  chills.      Vital  Signs    ---    Pain scale 1, Ht-in 5 ft 6 in.      Physical Examination    ---    General: WD, WN obese. In NAD.    Psych: cooperative.    Neurologic: able to respond to stimuli and questions.    Eyes: sclera anicteric.    Head/Neck: NCAT.    Pulmonary: breathing comfortably. Cardiovascular: limbs all warm and well  perfused. Skin: No obvious rash. MSK:    Gait nonantalgic.    Marland Kitchen    Left Knee:    .Inspection: skin intact, no gross deformity, no effusion appreciated however  limited by habitis, no erythema    .Palpation: no boney tenderness, no crepitus, no warmth    .JIR:CVELFY 0- 90 flexion limited by pain; passive 0-120 pain limited    .Menisci: + posteromedial and lateral jointline tenderness, positive  McMurray''s for pain but no clunk, positive deep flexion, + pain with forced  full extension    .Ligaments: firm endpoint however there is increased translation with varus  stress at 0 and 30 degrees, no increased translation with valgus at 0/30; firm  endpoint no excessive translation with anterior/posterior drawer and Lachman  test    .Patella: negative grind, no quad inhibition, no excessive medial or lateral  translation, no apprehension    .Tendons: quad and patellar tendon without gap or tenderness, able to straight  leg raise, no extensor lag    .Marland Kitchen Normal Hip ROM compared to right however + pain with hip flexion IR/ER,  positive stinchfield, however states this is not the pain that causes  her to  come in.    Distal NV:    .Fires EHL,TA,GS,Peroneals    .Light touch intact DP,SP,Saph,Sural and tibial nerve distribution    .Foot/Toes warm and well perfused with brisk cap refill    .    Marland Kitchen    Marland KitchenIMAGING:Radiographs of the left knee were obtained and reviewed today. These  demonstrate no obvious fracture or dislocation. There is no degenerative  change. There is no obvious effusion. There is no gross evidence of  malalignment.      Assessments    ---    1\. Injury of meniscus of left knee, initial encounter - B01.7P1W (Primary),  probable    ---      Treatment    ---       **1\. Injury of meniscus of left knee, initial encounter**    Notes: Patient was seen and examined with Dr Everardo Beals today. She has numerous  symptoms consistent with meniscal injury and also has some likely superimposed  left hip pathology. Her hip is not her primary complaint today so we will  defer treatment and workup at this time. Given signs/symptoms suggestive of  intraarticular derangement of the left knee, we recommend obtaining MRI for  further evaluation. We discussed possible surgical intervention pending  results of the scan. We will see the patient back in clinic with the MRI  finidings.    ---      Follow Up    ---    after MRI    Electronically signed by Baron Hamper , MD on 01/27/2016 at 12:00 PM EST    Sign off status: Completed        * * *        [facility]    [address]    [place], [place] [address]    Tel: [phone]    Fax: [phone]              * * *  Patient: [name], [name] DOB: Nov 25, 1970 Progress Note: Baron Hamper, MD  01/26/2016    ---    Note generated by eClinicalWorks EMR/PM Software ([email or web address])

## 2020-06-07 NOTE — Progress Notes (Signed)
**Progress Notes**    ---    **Patient:** SAMUELLA, RASOOL     **Account Number:** [id number] **External MRN:** [id number]   **Appointment  Provider:** Brigitte Pulse, MD     **DOB:** Jan 08, 1971 **Age:** 72 Y **Sex:** Female   **Supervising Provider:**  Laddie Aquas, MD     **Phone:** [phone]   **Date:** 11/08/2017   **CHN#:** [id number]     **Address:** [address], [place], [id number]     **Pcp:** Phillips Grout, MD        * * *        **Subjective:**        ---       **Chief Complaints:**    --- ---       1\. NP/SKIN TAG REMOVAL/INTER BKD/GMA REF. 2. Evaluation of dermatologic  issues.    --- ---      **HPI:**    _GENERAL_ :    CC: eval of skin lesions    Portugese speaking (daughter present today for interpretation)    Patient presents for evaluation and management of the following.    New patient. Last seen in 2014 by Dr. 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 actinic damage - (b/l  arms) TAC 0.1% ointment    ----    Concerns today:    1\. Skin tags    Location: Neck    Duration: Years    Symptoms: Itchy    Current Treatments: None    Previous Treatments: None    Other: No personal history of skin cancer.    2\. Skin color changes: On b/l arms and neck. Present since 2014 and used TAC  0.1% ointment which she stated helped but now the color changes have returned  1 year ago. Denies bleeding, itching, pain.    ----    Family history:    skin cancer - negative    atopic dermatitis - negative    ----    Social history: non-smoker    ----    ROS: No other skin concerns. Patient feels well, denies fevers or chills.    --- ---       **Medical History:** Varicose veins.        --- ---      **Medications:** Not-Taking/PRN Vitamin D , Medication List reviewed and  reconciled with the patient    --- ---       **Allergies:** N.K.D.A.    --- ---         **Objective:**        ---       **Vitals:** Pain scale 0, Ht-in 5 ft 6 in, Ht-cm 167.64.    --- ---          **Assessment:**        ---       **Assessment:**    1\. 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 Hyperpigmentation - L81.9    4\. Acrochordon - L91.8    --- ---      PHYSICAL EXAMINATION:    GENERAL: Well appearing and developed, in no acute distress    MOOD/AFFECT: Within normal limits    SKIN: A skin examination was performed including head/face, eyes, lips, neck,  right upper extremity, left upper extremity, hands/fingers/fingernails, chest,  abdomen, back, axillae,***right lower extremity, left lower extremity,  feet/toenails, genitalia/ buttocks, scalp.  All findings were negative in the  physical exam above other than what is noted below. Pertinent exam findings  include (see embedded into assessment below):    *****    ASSESSMENT:    1\. Melanocytic nevi, benign: scattered brown to skin colored papules and  macules with regular pigment network noted on dermoscopy    2\. Seborrheic keratoses, benign: scattered brown round stuck on appearing  papules    3\. Acrochordon, benign: skin colored to hyperpigmented pedunculated papules  on the neck    - s/p TBSE today.    - Counseled on etiology of findings.    - Continue to monitor with monthly self skin exams    - ABCDEs of melanoma and importance of sun protection reviewed, including  SPF, clothing protection and avoidance of tanning    - Pt advised to return to clinic for any new or concerning lesions.    - Lesions not suspicious for malignancy based on clinical exam and dermoscopy  findings; Do not recommend removal at this time    2\. Hyperpigmentation: hyperpigmented patches with a slightly rippled  appearance on the b/l forearms and anterior neck    - Ddx macular amyloid vs. photosensitive disorder given distribution    - She experienced improvement with TAC 0.1% ointment in the past so will re-  prescribe today    - Start Triamcinolone 0.1% ointment once to twice daily for 5 days a week.  Stop when clear and  restarting as needed. For severe cases may use twice daily  for 2 weeks straight. Side effects discussed with patient: skin thinning,  discoloration, rare systemic absorption.    RTC PRN.         **Plan:**        ---         **1\. Others**    Start Triamcinolone Acetonide Ointment, 0.1 %, once to twice daily for 5 days  a week. Stop when clear and restart as needed, Externally, as directed, 14  days, 80 g, Refills 3 .    ---      **Follow Up:** prn    --- ---    ---    ---                ---    **Appointment Provider:** Brigitte Pulse, MD    ---     **Patient:** Calef, Zen **DOB:** 01-Feb-1971 **Date:** 11/08/2017    ---    Electronically signed by Sheran Luz MD on 11/08/2017 at 01:24 PM EDT    Sign off status: Completed

## 2020-06-07 NOTE — Progress Notes (Signed)
* * *    [name], [name] **DOB:** 1970/07/14 [phone] F) **Acc No.** [id number] **DOS:**  02/09/2016    ---         [name], Zahava**    --- ---    32 Y old Female, DOB: January 29, 1971, External MRN: [id number]    Account Number: [id number]    [address], [place]    Home: [phone]    Guarantor: [employer] Insurance: [employer] PPO Payer ID: PAPER    PCP: Phillips Grout, MD Referring: Kendal Hymen, MD External Visit  ID: [id number]    [facility]        * * *    02/09/2016  Progress Notes: Baron Hamper, MD **CHN#:** [id number]    --- ---    ---         **Current Medications**    ---    Taking     * Vitamin D     ---        **Reason for Appointment**    ---       1\. 2W FU MRI RESULTS LEFT KNEE    ---       **History of Present Illness**    ---     _GENERAL_ :    [name] is a 50 year-old [place] speaking female presenting with her  daughter acting as an interpreter for her f/u for her left knee MRI results.  She has done PT without relief of her symptoms. She denies any changes to her  knee pain, trauma, numbness or tingling of her lower extremity.       **Examination**    ---     _GENERAL:_    General Well-appearing, No acute distress.    On examination of the LLE, the skin of the left knee is intact, without  swelling, ecchymosis or deformity. Mild TTP of the anterior lateral of knee.  Neurovascular intact.     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           **Assessments**    ---    1\. Injury of meniscus of left knee, initial encounter - S83.8X2A (Primary),  probable    ---       **Treatment**    ---       **1\. Injury of meniscus of left knee, initial encounter**    Notes:  The patient was seen and evaluated with Dr Everardo Beals. The MRI of the left  knee was reviewed with the patient. The results of the MRI and the clinical  exam do not make  her a surgerical candidate. A Celestone injection was offered  to the patient for her symptoms. She agreed to the injection. A f/u is  scheduled for 6 weeks to re-evaluate her symptoms. All her questions were  answered. .    ---      **Procedures**    ---     _ORT_ :    Procedure in Office Today Following discussion of benefits/risks, under  sterile conditions, an inj of left knee with 2cc celestone and 3cc of 1%  lidocane was carried out in the office today.          **Procedure Codes**    ---       20610 05: Aspiration / injection; major joint/bursa    ---       **  Follow Up**    ---    6 Weeks (Reason: f/u)    Electronically signed by Baron Hamper , MD on 10/09/2018 at 10:58 AM EDT    Sign off status: Completed        * * *        [facility]    [address]    [place], [address]    [place], [place] [address]    Tel: [phone]    Fax: [phone]              * * *           Progress Note: Baron Hamper, MD 02/09/2016    ---    Note generated by eClinicalWorks EMR/PM Software ([email or web address])

## 2020-08-26 ENCOUNTER — Inpatient Hospital Stay
Admit: 2020-08-26 | Discharge: 2020-08-27 | Disposition: A | Discharge status: Home / Self Care | Payer: PRIVATE HEALTH INSURANCE | Attending: Emergency Medicine

## 2020-08-26 ENCOUNTER — Other Ambulatory Visit

## 2020-08-26 ENCOUNTER — Emergency Department: Admit: 2020-08-26 | Payer: PRIVATE HEALTH INSURANCE | Primary: Geriatric Medicine

## 2020-08-26 ENCOUNTER — Encounter (HOSPITAL_BASED_OUTPATIENT_CLINIC_OR_DEPARTMENT_OTHER)

## 2020-08-26 ENCOUNTER — Encounter

## 2020-08-26 DIAGNOSIS — K429 Umbilical hernia without obstruction or gangrene: Secondary | ICD-10-CM

## 2020-08-26 LAB — COMPREHENSIVE METABOLIC PANEL
ALT: 14 U/L (ref 0–55)
AST: 19 U/L (ref 6–42)
Albumin: 4.4 g/dL (ref 3.2–5.0)
Alkaline phosphatase: 91 U/L (ref 30–130)
Anion Gap: 7 mmol/L (ref 3–14)
BUN: 12 mg/dL (ref 6–24)
Bilirubin, total: 0.6 mg/dL (ref 0.2–1.2)
CO2 (Bicarbonate): 27 mmol/L (ref 20–32)
Calcium: 9.8 mg/dL (ref 8.5–10.5)
Chloride: 104 mmol/L (ref 98–110)
Creatinine: 0.77 mg/dL (ref 0.55–1.30)
Glucose: 93 mg/dL (ref 70–139)
Potassium: 3.6 mmol/L (ref 3.6–5.2)
Protein, total: 8.2 g/dL (ref 6.0–8.4)
Sodium: 138 mmol/L (ref 135–146)
eGFRcr: 95 mL/min/{1.73_m2} (ref >=60)

## 2020-08-26 LAB — URINALYSIS REFLEX TO CULTURE
Bilirubin, Ur: NEGATIVE
Blood, Ur: NEGATIVE
Glucose, Ur: NEGATIVE
Hyaline Casts, Ur: 0 /LPF (ref 0–8)
Ketones, Ur: NEGATIVE
Nitrite, Ur: NEGATIVE
Protein, Ur: NEGATIVE
RBC, Ur: <1 /HPF (ref 0–4)
Specific Gravity, Ur: 1.003 (ref 1.001–1.035)
Squamous Epithelial Cells, Ur: NEGATIVE /HPF
Urobilinogen, Ur: 0.2 EU
WBC, Ur: 10.9 /HPF — ABNORMAL HIGH (ref 0–5)
pH, Ur: 7

## 2020-08-26 LAB — TROPONIN I: Troponin I: <0.01 ng/mL (ref <0.03)

## 2020-08-26 LAB — CBC WITH DIFFERENTIAL
Basophils %: 0.3 %
Basophils Absolute: 0.03 10*3/uL (ref 0.00–0.22)
Eosinophils %: 0.8 %
Eosinophils Absolute: 0.07 10*3/uL (ref 0.00–0.50)
Hematocrit: 38.9 % (ref 32.0–47.0)
Hemoglobin: 12.3 g/dL (ref 11.0–16.0)
Immature Granulocytes %: 0.2 %
Immature Granulocytes Absolute: 0.02 10*3/uL (ref 0.00–0.10)
Lymphocyte %: 27.8 %
Lymphocytes Absolute: 2.5 10*3/uL (ref 0.70–4.00)
MCH: 26.7 pg (ref 26.0–34.0)
MCHC: 31.6 g/dL (ref 31.0–37.0)
MCV: 84.6 fL (ref 80.0–100.0)
MPV: 11.6 fL (ref 9.1–12.4)
Monocytes %: 6.3 %
Monocytes Absolute: 0.57 10*3/uL (ref 0.36–0.77)
NRBC %: 0 % (ref 0.0–0.0)
NRBC Absolute: 0 10*3/uL (ref 0.00–2.00)
Neutrophil %: 64.6 %
Neutrophils Absolute: 5.79 10*3/uL (ref 1.50–7.95)
Platelets: 243 10*3/uL (ref 150–400)
RBC: 4.6 M/uL (ref 3.70–5.20)
RDW-CV: 12.8 % (ref 11.5–14.5)
RDW-SD: 39.1 fL (ref 35.0–51.0)
WBC: 9 10*3/uL (ref 4.0–11.0)

## 2020-08-26 LAB — LIPASE: Lipase: 23 U/L (ref 8–60)

## 2020-08-26 LAB — HCG, QUANTITATIVE, PREGNANCY: hCG, serum, quantitative: <5 m[IU]/mL (ref <=5)

## 2020-08-26 MED ORDER — acetaminophen (Tylenol) 325 mg tablet
325 | ORAL_TABLET | Freq: Four times a day (QID) | ORAL | 0 refills | 8.00000 days | Status: DC | PRN
Start: 2020-08-26 — End: 2020-08-26

## 2020-08-26 MED ORDER — acetaminophen (Tylenol) 325 mg tablet
325 | ORAL_TABLET | Freq: Four times a day (QID) | ORAL | 0 refills | 8.00000 days | Status: DC | PRN
Start: 2020-08-26 — End: 2020-08-31

## 2020-08-26 MED ORDER — ibuprofen 400 mg tablet
400 | ORAL_TABLET | Freq: Four times a day (QID) | ORAL | 0 refills | Status: DC | PRN
Start: 2020-08-26 — End: 2020-08-26

## 2020-08-26 MED ORDER — ibuprofen 400 mg tablet
400 | ORAL_TABLET | Freq: Four times a day (QID) | ORAL | 0 refills | Status: DC | PRN
Start: 2020-08-26 — End: 2020-08-31

## 2020-08-26 MED ORDER — iopamidol (Isovue-300) 61 % injection 50 mL
300 | Freq: Once | INTRAVENOUS | Status: AC
Start: 2020-08-26 — End: 2020-08-26
  Administered 2020-08-26: 18:00:00 50 mL via ORAL

## 2020-08-26 MED ORDER — acetaminophen (Tylenol) tablet 650 mg
325 | Freq: Once | ORAL | Status: AC
Start: 2020-08-26 — End: 2020-08-26
  Administered 2020-08-26: 23:00:00 650 mg via ORAL

## 2020-08-26 MED FILL — ACETAMINOPHEN 325 MG TABLET: 325 325 mg | ORAL | Qty: 2

## 2020-08-26 NOTE — ED Provider Notes (Signed)
EMERGENCY DEPARTMENT ENCOUNTER    ATTENDING NOTE    Date of service: 08/26/2020  6:06 PM    Chief Complaint   Patient presents with   ? Abdominal Pain     The case with PA and agree with plan.  Nursing notes reviewed     HISTORY OF PRESENT ILLNESS:    This is a 50 year old female with no significant past medical history who in 2008 had a hernia repair with a mesh placement she now presents with a 2-week history of on and off infraumbilical pain.  Reports that the pain today has been more constant has not taken any medication for the pain.  She also developed left upper quadrant abdominal pain this morning.  She reports that the pain is worsened when she coughs or when she ambulates.  Reports no nausea or vomiting.  She states that she had a normal bowel movement yesterday which was nonbloody.  Denies any dysuria urgency or frequency.  Currently reports that the pain is moderate in nature.  She denies any chest pain shortness of breath cough fever chills    REVIEW OF SYSTEMS    Constitutional: no fevers  Eyes: no acute vision change   ENT: no sore throat  Pulmonary: no SOB, no cough  Cardiovascular: no CP  GI:  + Abd pain, no vomiting, no diarrhea, no black or bloody stool  GU: no dysuria, hematuria, urinary frequency  Neurologic: no headache  Musculoskeletal: no significant extremity edema  Skin: no rash    PAST MEDICAL HISTORY / PROBLEM LIST    History reviewed. No pertinent past medical history.    There is no problem list on file for this patient.      PAST SURGICAL HISTORY    History reviewed. No pertinent surgical history.    MEDICATIONS     Previous Medications    No medications on file        ALLERGIES    No Known Allergies    SOCIAL HISTORY    Social History     Tobacco Use   ? Smoking status: Not on file   ? Smokeless tobacco: Not on file   Substance Use Topics   ? Alcohol use: Not on file     Social History     Substance and Sexual Activity   Drug Use Not on file       FAMILY HISTORY    No family  history on file.    PHYSICAL EXAM    ED Triage Vitals   Temp Pulse Resp BP   08/26/20 1405 08/26/20 1405 08/26/20 1405 08/26/20 1405   36 ?C (96.8 ?F) 69 16 126/87      SpO2 Temp src Heart Rate Source Patient Position   08/26/20 1405 -- 08/26/20 1929 08/26/20 1929   99 %  Right Lying      BP Location FiO2 (%)     08/26/20 1929 --     Right arm           Gray-haired nontoxic-appearing at times tearful head is normocephalic atraumatic pupils are equal and reactive moist mucous membranes neck is supple lungs are clear to auscultation bilaterally CVS RRR normal S1-S2 no murmurs abdomen is soft there is voluntary guarding palpable ventral hernia noted in the infraumbilical region is fully reducible and mildly tender to palpation there are no skin changes over the hernia.  The abdomen is otherwise soft and nontender including the right upper right lower quadrant and left upper quadrant.  No CVAT no pedal edema calf tenderness or asymmetry    LABS    Labs Reviewed   URINALYSIS REFLEX TO CULTURE - Abnormal       Result Value    Color, Ur Yellow      Clarity, Ur Clear      Specific Gravity, Ur 1.003      pH, Ur 7.0      Protein, Ur Negative      Glucose, Ur Negative      Ketones, Ur Negative      Blood, Ur Negative      Bilirubin, Ur Negative      Urobilinogen, Ur 0.2       Nitrite, Ur Negative      Leukocyte Esterase, Ur 1+ (*)     WBC, Ur 10.9 (*)     RBC, Ur <1      Squamous Epithelial Cells, Ur Negative      Bacteria, Ur Few      Hyaline Casts, Ur 0.0     LIPASE - Normal    Lipase 23     HCG, QUANTITATIVE, PREGNANCY - Normal    hCG, serum, quantitative <5      Narrative:     Expected Results:  Adult Female: 0-1 mIU/mL  Non-pregnant Female: 0-4 mIU/mL    If pregnancy is expected, suggest repeat in 24-72 hours.    The concentration of HCG rises rapidly during early pregnancy to a level of 5,000 to 200,000 mIU/mL at 10-12 weeks, followed by a slow decline to levels of 1,000 to 50,000 mIU/mL during the 3rd trimester.    The  use of this assay to monitor or diagnose patients with cancer or any other condiion unrelated to pregnancy has not been cleared or approved by the FDA or the manufacturer of the assay.   TROPONIN I - Normal    Troponin I <0.01     CBC W/DIFF    Narrative:     The following orders were created for panel order CBC and differential.  Procedure                               Abnormality         Status                     ---------                               -----------         ------                     CBC w/ Differential[[id number]]                               Final result                 Please view results for these tests on the individual orders.   COMPREHENSIVE METABOLIC PANEL    Sodium 138      Potassium 3.6      Chloride 104      CO2 (Bicarbonate) 27      Anion Gap 7      BUN 12      Creatinine 0.77      eGFRcr 95  Glucose 93      Fasting? Unknown      Calcium 9.8      AST 19      ALT 14      Alkaline phosphatase 91      Protein, total 8.2      Albumin 4.4      Bilirubin, total 0.6     CBC WITH DIFFERENTIAL - WAM AND NON-WAM    WBC 9.0      RBC 4.60      Hemoglobin 12.3      Hematocrit 38.9      MCV 84.6      MCH 26.7      MCHC 31.6      RDW-CV 12.8      RDW-SD 39.1      Platelets 243      MPV 11.6      Neutrophil % 64.6      Lymphocyte % 27.8      Monocytes % 6.3      Eosinophils % 0.8      Basophils % 0.3      Immature Granulocytes % 0.2      NRBC % 0.0      Neutrophils Absolute 5.79      Lymphocytes Absolute 2.50      Monocytes Absolute 0.57      Eosinophils Absolute 0.07      Basophils Absolute 0.03      Immature Granulocytes Absolute 0.02      NRBC Absolute 0.00     POCT URINALYSIS DIPSTICK   POCT PREGNANCY, URINE        RADIOLOGY    X-rays contemporaneously visualized and interpreted by me.      XR CHEST 2 VIEWS   Preliminary Result   No acute pulmonary process.      CT ABDOMEN PELVIS WO CONTRAST   Preliminary Result   1.  Small fat-containing umbilical hernia with mild prominent with mild  inflammatory fat stranding within the hernia sac.   2.  No other acute CT findings within the abdomen and pelvis.                MEDICAL DECISION MAKING     Pertinent labs and imaging studies reviewed.  50 year old female who presents with a 2-week history of intermittent pain the pain is now more constant.  I am able to palpate a ventral hernia in the infraumbilical region clinically it appears that the hernia is fully reducible but she is quite uncomfortable and tearful.  We will obtain a CT of the abdomen and pelvis    ED COURSE    ED Course as of 08/26/20 2124   Fri Aug 26, 2020   2121 Laboratory data reveals no leukocytosis with a white count of 9 patient's hemoglobin is normal at 12.  Platelets are normal at 243. [AD]   2121 Serum electrolytes are normal with a normal sodium of 138 and a potassium of 3.6.  Patient's renal function is also normal with a creatinine of 0.8. [AD]   2122 LFTs and lipase are normal [AD]   2122 Urinalysis shows 1+ leukocytes with 10 WBCs however in the absence of any urinary symptoms my suspicion for urinary tract infection is very low.  We will send the urine for urine culture. [AD]   2122 A chest x-ray obtained was visualized by me and interpreted as no obvious infiltrates there is no free air under the diaphragm [AD]   2123 She underwent  the CT of the abdomen and pelvis which revealed a fat-containing hernia with no loops of bowel.  Patient was given reassurance pain control and encouraged to follow-up with general surgery [AD]      ED Course User Index  [AD] Doran Stabler, MD         Diagnoses as of 08/26/20 2124   Umbilical hernia without obstruction and without gangrene           PROCEDURES    Procedures        CLINICAL IMPRESSION:    Umbilical hernia with no evidence of incarceration      FINAL DIAGNOSIS:    1. Umbilical hernia without obstruction and without gangrene             Doran Stabler, MD  08/26/20 2124

## 2020-08-26 NOTE — Other (Signed)
Patient Education   Table of Contents       Hernia, Adult     To view videos and all your education online visit,   [email or web address]   or scan this QR code with your smartphone.                    Hernia, Adult              A hernia happens when tissue inside your body pushes out through a weak spot in your belly muscles (abdominal wall). This makes a round lump (bulge). The lump may be:       In a scar from surgery that was done in your belly (incisional hernia).       Near your belly button (umbilical hernia).      In your groin (inguinal hernia). Your groin is the area where your leg meets your lower belly (abdomen). This kind of hernia could also be:       In your scrotum, if you are female.       In folds of skin around your vagina, if you are female.       In your upper thigh (femoral hernia).       Inside your belly (hiatal hernia). This happens when your stomach slides above the muscle between your belly and your chest (diaphragm).     If your hernia is small and it does not cause pain, you may not need treatment. If your hernia is large or it causes pain, you may need surgery.     Follow these instructions at home:   Activity        Avoid stretching or overusing (straining) the muscles near your hernia. Straining can happen when you:       Lift something heavy.       Poop (have a bowel movement).      Do not  lift anything that is heavier than 10 lb (4.5 kg), or the limit that you are told, until your doctor says that it is safe.       Use the strength of your legs when you lift something heavy. Do not  use only your back muscles to lift.     General instructions        Do these things if told by your doctor so you do not have trouble pooping (constipation):       Drink enough fluid to keep your pee (urine) pale yellow.       Eat foods that are high in fiber. These include fresh fruits and vegetables, whole grains, and beans.       Limit foods that are high in fat and processed sugars. These  include foods that are fried or sweet.       Take medicine for trouble pooping.       When you cough, try to cough gently.       You may try to push your hernia in by very gently pressing on it when you are lying down. Do not  try to force the bulge back in if it will not push in easily.       If you are overweight, work with your doctor to lose weight safely.      Do not  use any products that have nicotine or tobacco in them. These include cigarettes and e-cigarettes. If you need help quitting, ask your doctor.       If you will be  having surgery (hernia repair), watch your hernia for changes in shape, size, or color. Tell your doctor if you see any changes.       Take over-the-counter and prescription medicines only as told by your doctor.       Keep all follow-up visits as told by your doctor.     Contact a doctor if:         You get new pain, swelling, or redness near your hernia.       You poop fewer times in a week than normal.       You have trouble pooping.       You have poop (stool) that is more dry than normal.       You have poop that is harder or larger than normal.     Get help right away if:         You have a fever.       You have belly pain that gets worse.       You feel sick to your stomach (nauseous).       You throw up (vomit).       Your hernia cannot be pushed in by very gently pressing on it when you are lying down. Do not  try to force the bulge back in if it will not push in easily.      Your hernia:       Changes in shape or size.       Changes color.       Feels hard or it hurts when you touch it.     These symptoms may represent a serious problem that is an emergency. Do not wait to see if the symptoms will go away. Get medical help right away. Call your local emergency services (911 in the [place]   Summary         A hernia happens when tissue inside your body pushes out through a weak spot in the belly muscles. This creates a bulge.       If your hernia is small and it does not hurt,  you may not need treatment. If your hernia is large or it hurts, you may need surgery.       If you will be having surgery, watch your hernia for changes in shape, size, or color. Tell your doctor about any changes.     This information is not intended to replace advice given to you by your health care provider. Make sure you discuss any questions you have with your health care provider.     Document Released: 06/07/2011Document Revised: 04/09/2020Document Reviewed: 11/28/2016     Elsevier Patient Education ? 2021 [employer]

## 2020-08-26 NOTE — Discharge Instructions (Addendum)
You were seen in the emergency department August 26, 2020 for abdominal pain.  Your CT scan showed a umbilical hernia.  Your lab results were normal.  You have been given the information of our general surgery clinic which you should call and schedule an appointment for Monday morning.  You have been prescribed Tylenol and ibuprofen to use as needed for pain.  If you experience any worsening abdominal pain, redness over the area of the hernia, you are unable to push the hernia back in, fever develops, nausea, vomiting return to the emergency department for further evaluation.

## 2020-08-26 NOTE — ED Triage Notes (Signed)
Pt with abd pain and headache started about 2 weeks ago on and off but increasingly getting worse. No n/v/d no fevers no urinary issues.

## 2020-08-26 NOTE — ED Progress Note (Signed)
Triage PA note: Patient is a 50 y.o. with a PMH of No past medical history on file. that presents to the ED with c/o of abdominal pain & HA. She's had on and off abdominal pain x 2 weeks directly under her belly button. No NVD, fevers, pain with urination. Last BM yesterday was nl. No blood. Has a history of hernia repair in 2018      On exam TTP in umbilical area. No CVAT.    I saw this patient in triage and the care was then transferred to the primary ED team    [facility]       Suella Broad, PA  08/26/20 1406

## 2020-08-26 NOTE — ED Provider Notes (Signed)
HPI   Chief Complaint   Patient presents with   . Abdominal Pain       Patient is a 50 year old [place] speaking female with PMHx of hernia repair in 2008 with mesh who presents today for intermittent, nonradiating infraumbilical abdominal pain for the past 2 weeks, but worsened today and was more constant. Pt reports pain is  worse with ambulation or coughing.  Patient has not tried anything for her pain.  Patient also endorses some left upper quadrant abdominal pain that started today, has not tried anything for this.  Patient endorses a normal bowel movement yesterday that was nonbloody.  Patient denies any urinary or vaginal symptoms.  Patient denies fevers, chills, nausea, vomiting.      History provided by:  Patient  Language interpreter used: Yes                  Glasgow Coma Scale Score: 15                                  Patient History   History reviewed. No pertinent past medical history.  History reviewed. No pertinent surgical history.  No family history on file.  Social History     Tobacco Use   . Smoking status: Not on file   . Smokeless tobacco: Not on file   Substance Use Topics   . Alcohol use: Not on file   . Drug use: Not on file       Review of Systems   Review of Systems   Constitutional: Negative for chills and fever.   HENT: Negative for congestion and sore throat.    Eyes: Negative for visual disturbance.   Respiratory: Negative for cough and shortness of breath.    Cardiovascular: Negative for chest pain.   Gastrointestinal: Positive for abdominal pain. Negative for blood in stool, diarrhea, nausea and vomiting.   Genitourinary: Negative for dysuria and vaginal bleeding.   Musculoskeletal: Negative for gait problem.   Skin: Negative for color change.   Neurological: Negative for dizziness and light-headedness.   Psychiatric/Behavioral: Negative for agitation and behavioral problems.       Physical Exam   ED Triage Vitals   Temp Pulse Resp BP   08/26/20 1405 08/26/20 1405 08/26/20 1405  08/26/20 1405   36 C (96.8 F) 69 16 126/87      SpO2 Temp src Heart Rate Source Patient Position   08/26/20 1405 -- 08/26/20 1929 08/26/20 1929   99 %  Right Lying      BP Location FiO2 (%)     08/26/20 1929 --     Right arm        Physical Exam  Vitals and nursing note reviewed.   Constitutional:       Comments: Well-developed female, intermittently tearful throughout exam, speaking in full sentences, no signs of respiratory distress   HENT:      Head: Normocephalic and atraumatic.      Mouth/Throat:      Mouth: Mucous membranes are moist.      Pharynx: Oropharynx is clear.   Eyes:      Extraocular Movements: Extraocular movements intact.      Conjunctiva/sclera: Conjunctivae normal.      Pupils: Pupils are equal, round, and reactive to light.   Cardiovascular:      Rate and Rhythm: Normal rate and regular rhythm.      Heart  sounds: No murmur heard.  Pulmonary:      Effort: Pulmonary effort is normal.      Breath sounds: No wheezing, rhonchi or rales.   Abdominal:      Palpations: Abdomen is soft.      Comments: Ventral hernia in the infraumbilical region palpated, tender to palpation, reducible, no signs of strangulation, no overlying erythema, warmth, otherwise abdomen soft, nontender, bowel sounds intact, no rebound guarding or rigidity   Musculoskeletal:      Cervical back: Normal range of motion and neck supple.      Right lower leg: No edema.      Left lower leg: No edema.      Comments: Moving all extremities   Skin:     General: Skin is warm and dry.      Capillary Refill: Capillary refill takes less than 2 seconds.   Neurological:      Mental Status: She is alert and oriented to person, place, and time.      Comments: Lucid, following commands   Psychiatric:         Mood and Affect: Mood normal.         Behavior: Behavior normal.       XR CHEST 2 VIEWS   Preliminary Result   No acute pulmonary process.      CT ABDOMEN PELVIS WO CONTRAST   Preliminary Result   1.  Small fat-containing umbilical hernia  with mild prominent with mild inflammatory fat stranding within the hernia sac.   2.  No other acute CT findings within the abdomen and pelvis.                Labs Reviewed   URINALYSIS REFLEX TO CULTURE - Abnormal       Result Value    Color, Ur Yellow      Clarity, Ur Clear      Specific Gravity, Ur 1.003      pH, Ur 7.0      Protein, Ur Negative      Glucose, Ur Negative      Ketones, Ur Negative      Blood, Ur Negative      Bilirubin, Ur Negative      Urobilinogen, Ur 0.2       Nitrite, Ur Negative      Leukocyte Esterase, Ur 1+ (*)     WBC, Ur 10.9 (*)     RBC, Ur <1      Squamous Epithelial Cells, Ur Negative      Bacteria, Ur Few      Hyaline Casts, Ur 0.0     LIPASE - Normal    Lipase 23     HCG, QUANTITATIVE, PREGNANCY - Normal    hCG, serum, quantitative <5      Narrative:     Expected Results:  Adult Female: 0-1 mIU/mL  Non-pregnant Female: 0-4 mIU/mL    If pregnancy is expected, suggest repeat in 24-72 hours.    The concentration of HCG rises rapidly during early pregnancy to a level of 5,000 to 200,000 mIU/mL at 10-12 weeks, followed by a slow decline to levels of 1,000 to 50,000 mIU/mL during the 3rd trimester.    The use of this assay to monitor or diagnose patients with cancer or any other condiion unrelated to pregnancy has not been cleared or approved by the FDA or the manufacturer of the assay.   TROPONIN I - Normal    Troponin I <0.01  CBC W/DIFF    Narrative:     The following orders were created for panel order CBC and differential.  Procedure                               Abnormality         Status                     ---------                               -----------         ------                     CBC w/ Differential[[id number]]                               Final result                 Please view results for these tests on the individual orders.   COMPREHENSIVE METABOLIC PANEL    Sodium 138      Potassium 3.6      Chloride 104      CO2 (Bicarbonate) 27      Anion Gap 7      BUN 12       Creatinine 0.77      eGFRcr 95      Glucose 93      Fasting? Unknown      Calcium 9.8      AST 19      ALT 14      Alkaline phosphatase 91      Protein, total 8.2      Albumin 4.4      Bilirubin, total 0.6     CBC WITH DIFFERENTIAL - WAM AND NON-WAM    WBC 9.0      RBC 4.60      Hemoglobin 12.3      Hematocrit 38.9      MCV 84.6      MCH 26.7      MCHC 31.6      RDW-CV 12.8      RDW-SD 39.1      Platelets 243      MPV 11.6      Neutrophil % 64.6      Lymphocyte % 27.8      Monocytes % 6.3      Eosinophils % 0.8      Basophils % 0.3      Immature Granulocytes % 0.2      NRBC % 0.0      Neutrophils Absolute 5.79      Lymphocytes Absolute 2.50      Monocytes Absolute 0.57      Eosinophils Absolute 0.07      Basophils Absolute 0.03      Immature Granulocytes Absolute 0.02      NRBC Absolute 0.00         ED Course & MDM   ED Course as of 08/27/20 1526   Fri Aug 26, 2020   2121 Laboratory data reveals no leukocytosis with a white count of 9 patient's hemoglobin is normal at 12.  Platelets are normal at 243. [AD]   2121 Serum electrolytes are normal with a normal sodium of 138 and  a potassium of 3.6.  Patient's renal function is also normal with a creatinine of 0.8. [AD]   2122 LFTs and lipase are normal [AD]   2122 Urinalysis shows 1+ leukocytes with 10 WBCs however in the absence of any urinary symptoms my suspicion for urinary tract infection is very low.  We will send the urine for urine culture. [AD]   2122 A chest x-ray obtained was visualized by me and interpreted as no obvious infiltrates there is no free air under the diaphragm [AD]   2123 She underwent the CT of the abdomen and pelvis which revealed a fat-containing hernia with no loops of bowel.  Patient was given reassurance pain control and encouraged to follow-up with general surgery [AD]      ED Course User Index  [AD] Doran Stabler, MD         Diagnoses as of 08/27/20 1526   Umbilical hernia without obstruction and without gangrene       MDM  Number of  Diagnoses or Management Options  Umbilical hernia without obstruction and without gangrene  Diagnosis management comments: Summary:  Patient is a 50 year old [place] speaking female with PMHx of hernia repair in 2008 with mesh who presents today for intermittent, nonradiating infraumbilical abdominal pain for the past 2 weeks, but worsened today and was more constant.    Differential diagnosis: Reducible ventral hernia versus obstruction/incarceration unlikely however patient is tearful throughout exam and appears uncomfortable so we will rule out with CT abdomen pelvis    Labs: UA with 1+ leukocytes and 10.9 white blood cells, few bacteria, pregnancy test negative, troponin negative, lipase 23, CMP unremarkable with normal electrolytes, CBC unremarkable with normal white blood cell count at 9.0 and normal hemoglobin and hematocrit at 12.3 and 38.9 respectively    Imaging:  Chest x-ray Impression:  No acute pulmonary process.     CT abdomen pelvis Impression:  1. Small fat-containing umbilical hernia with mild prominent with mild inflammatory fat stranding within the hernia sac.   2. No other acute CT findings within the abdomen and pelvis.     EKG: Normal sinus rhythm at 61 bpm, PR 136, QTc 411, no STEMI    Intervention: Tylenol    Working impression: umbilical hernia without obstruction or incarceration    Dispo: Patient reassured after CT scan that her hernia is fully reducible and she should follow-up with general surgery outpatient for elective surgical management.  Patient to take Tylenol or ibuprofen as needed for pain.  Offered patient work note and she declined.  Discussed with patient movements and increasing intra-abdominal pressure that we will make her pain worse. Unlikely infection in urine although 1+ leuks and 10 WBCs, pt has no urinary symptoms. Will await culture results and treat accordingly. Pt discharged to home with general surgery follow up. Pt verbalized understanding for return  precautions.           Trudie Reed, [place]  08/27/20 1538

## 2020-08-27 ENCOUNTER — Telehealth (HOSPITAL_BASED_OUTPATIENT_CLINIC_OR_DEPARTMENT_OTHER)

## 2020-08-29 ENCOUNTER — Other Ambulatory Visit

## 2020-08-29 ENCOUNTER — Encounter (HOSPITAL_BASED_OUTPATIENT_CLINIC_OR_DEPARTMENT_OTHER)

## 2020-08-29 ENCOUNTER — Inpatient Hospital Stay
Admission: EM | Admit: 2020-08-29 | Discharge: 2020-08-31 | Disposition: A | Discharge status: Home / Self Care | Payer: PRIVATE HEALTH INSURANCE | Admitting: Surgery

## 2020-08-29 DIAGNOSIS — K429 Umbilical hernia without obstruction or gangrene: Secondary | ICD-10-CM

## 2020-08-29 DIAGNOSIS — K432 Incisional hernia without obstruction or gangrene: Secondary | ICD-10-CM

## 2020-08-29 LAB — URINALYSIS REFLEX TO CULTURE
Bilirubin, Ur: NEGATIVE
Blood, Ur: NEGATIVE
Glucose, Ur: NEGATIVE
Hyaline Casts, Ur: 0 /LPF (ref 0–8)
Ketones, Ur: NEGATIVE
Nitrite, Ur: NEGATIVE
Protein, Ur: NEGATIVE
RBC, Ur: <1 /HPF (ref 0–4)
Specific Gravity, Ur: 1.009 (ref 1.001–1.035)
Urobilinogen, Ur: 0.2 EU
WBC, Ur: 13.7 /HPF — ABNORMAL HIGH (ref 0–5)
pH, Ur: 6.5

## 2020-08-29 LAB — COMPREHENSIVE METABOLIC PANEL
ALT: 17 U/L (ref 0–55)
AST: 21 U/L (ref 6–42)
Albumin: 4.4 g/dL (ref 3.2–5.0)
Alkaline phosphatase: 83 U/L (ref 30–130)
Anion Gap: 7 mmol/L (ref 3–14)
BUN: 13 mg/dL (ref 6–24)
Bilirubin, total: 0.6 mg/dL (ref 0.2–1.2)
CO2 (Bicarbonate): 27 mmol/L (ref 20–32)
Calcium: 9.9 mg/dL (ref 8.5–10.5)
Chloride: 106 mmol/L (ref 98–110)
Creatinine: 0.74 mg/dL (ref 0.55–1.30)
Glucose: 91 mg/dL (ref 70–139)
Potassium: 3.9 mmol/L (ref 3.6–5.2)
Protein, total: 8.1 g/dL (ref 6.0–8.4)
Sodium: 140 mmol/L (ref 135–146)
eGFRcr: 100 mL/min/{1.73_m2} (ref >=60)

## 2020-08-29 LAB — CBC WITH DIFFERENTIAL
Basophils %: 0.4 %
Basophils Absolute: 0.03 10*3/uL (ref 0.00–0.22)
Eosinophils %: 0.8 %
Eosinophils Absolute: 0.06 10*3/uL (ref 0.00–0.50)
Hematocrit: 37.9 % (ref 32.0–47.0)
Hemoglobin: 12 g/dL (ref 11.0–16.0)
Immature Granulocytes %: 0.4 %
Immature Granulocytes Absolute: 0.03 10*3/uL (ref 0.00–0.10)
Lymphocyte %: 28.7 %
Lymphocytes Absolute: 2.19 10*3/uL (ref 0.70–4.00)
MCH: 26.5 pg (ref 26.0–34.0)
MCHC: 31.7 g/dL (ref 31.0–37.0)
MCV: 83.8 fL (ref 80.0–100.0)
MPV: 12.1 fL (ref 9.1–12.4)
Monocytes %: 6.6 %
Monocytes Absolute: 0.5 10*3/uL (ref 0.36–0.77)
NRBC %: 0 % (ref 0.0–0.0)
NRBC Absolute: 0 10*3/uL (ref 0.00–2.00)
Neutrophil %: 63.1 %
Neutrophils Absolute: 4.82 10*3/uL (ref 1.50–7.95)
Platelets: 247 10*3/uL (ref 150–400)
RBC: 4.52 M/uL (ref 3.70–5.20)
RDW-CV: 12.8 % (ref 11.5–14.5)
RDW-SD: 38.7 fL (ref 35.0–51.0)
WBC: 7.6 10*3/uL (ref 4.0–11.0)

## 2020-08-29 LAB — PTT: aPTT: 38.8 s — ABNORMAL HIGH (ref 25.7–35.7)

## 2020-08-29 LAB — HCG, QUANTITATIVE, PREGNANCY: hCG, serum, quantitative: <5 m[IU]/mL (ref <=5)

## 2020-08-29 LAB — LACTIC ACID, WHOLE BLOOD: Lactate, Whole Blood: 0.9 mmol/L (ref 0.5–2.2)

## 2020-08-29 LAB — LIPASE: Lipase: 19 U/L (ref 8–60)

## 2020-08-29 LAB — PROTIME-INR
INR: 1.1
Protime: 13.4 s (ref 9.7–14.0)

## 2020-08-29 LAB — RAPID SARS-COV-2: SARS-CoV-2 RNA PCR: NEGATIVE

## 2020-08-29 MED ORDER — sodium chloride 0.9 % bolus 1,000 mL
0.9 | Freq: Once | INTRAVENOUS | Status: AC
Start: 2020-08-29 — End: 2020-08-29
  Administered 2020-08-29: 18:00:00 1000 mL via INTRAVENOUS

## 2020-08-29 MED ORDER — lactated Ringer's infusion
INTRAVENOUS | Status: DC
Start: 2020-08-29 — End: 2020-08-29

## 2020-08-29 MED ORDER — acetaminophen (Tylenol) solution 650 mg
325 | Freq: Four times a day (QID) | ORAL | Status: DC | PRN
Start: 2020-08-29 — End: 2020-08-31

## 2020-08-29 MED ORDER — enoxaparin (Lovenox) syringe 40 mg
40 | Freq: Every day | SUBCUTANEOUS | Status: DC
Start: 2020-08-29 — End: 2020-08-31
  Administered 2020-08-31: 12:00:00 40 mg via SUBCUTANEOUS

## 2020-08-29 MED ORDER — acetaminophen (Tylenol) tablet 650 mg
325 | Freq: Four times a day (QID) | ORAL | Status: DC | PRN
Start: 2020-08-29 — End: 2020-08-31
  Administered 2020-08-30: 20:00:00 650 mg via ORAL

## 2020-08-29 MED ORDER — ondansetron (Zofran) injection 4 mg
4 | Freq: Three times a day (TID) | INTRAMUSCULAR | Status: DC | PRN
Start: 2020-08-29 — End: 2020-08-31
  Administered 2020-08-30: 4 mg via INTRAVENOUS

## 2020-08-29 MED ORDER — oxyCODONE (Roxicodone) immediate release tablet 5 mg
5 | ORAL | Status: DC | PRN
Start: 2020-08-29 — End: 2020-08-31
  Administered 2020-08-30 – 2020-08-31 (×2): 5 mg via ORAL

## 2020-08-29 MED ORDER — lactated Ringer's infusion
INTRAVENOUS | Status: DC
Start: 2020-08-29 — End: 2020-08-30
  Administered 2020-08-30: 12:00:00 via INTRAVENOUS
  Administered 2020-08-30: 04:00:00 100 mL/h via INTRAVENOUS

## 2020-08-29 MED ORDER — oxyCODONE (Roxicodone) solution 5 mg
5 | ORAL | Status: DC | PRN
Start: 2020-08-29 — End: 2020-08-31

## 2020-08-29 MED ORDER — acetaminophen (Tylenol) suppository 650 mg
650 | Freq: Four times a day (QID) | RECTAL | Status: DC | PRN
Start: 2020-08-29 — End: 2020-08-31

## 2020-08-29 MED ORDER — HYDROmorphone (Dilaudid) injection 0.4 mg
2 | INTRAMUSCULAR | Status: DC | PRN
Start: 2020-08-29 — End: 2020-08-31

## 2020-08-29 MED ORDER — naloxone (Narcan) injection 0.1 mg
0.4 | INTRAMUSCULAR | Status: DC | PRN
Start: 2020-08-29 — End: 2020-08-31

## 2020-08-29 MED ORDER — ondansetron ODT (Zofran-ODT) disintegrating tablet 4 mg
4 | Freq: Three times a day (TID) | ORAL | Status: DC | PRN
Start: 2020-08-29 — End: 2020-08-31

## 2020-08-29 MED ORDER — ketorolac (Toradol) injection 30 mg
30 | Freq: Once | INTRAMUSCULAR | Status: AC
Start: 2020-08-29 — End: 2020-08-29
  Administered 2020-08-29: 18:00:00 30 mg via INTRAVENOUS

## 2020-08-29 MED ORDER — HYDROmorphone (Dilaudid) injection 0.4 mg
1 | INTRAMUSCULAR | Status: DC | PRN
Start: 2020-08-29 — End: 2020-08-29

## 2020-08-29 MED ORDER — ketorolac (Toradol) injection 15 mg
15 | Freq: Four times a day (QID) | INTRAMUSCULAR | Status: DC | PRN
Start: 2020-08-29 — End: 2020-08-31
  Administered 2020-08-30 – 2020-08-31 (×2): 15 mg via INTRAVENOUS

## 2020-08-29 MED FILL — KETOROLAC 30 MG/ML (1 ML) INJECTION SOLUTION: 30 30 mg/mL (1 mL) | INTRAMUSCULAR | Qty: 1

## 2020-08-29 NOTE — H&P (Signed)
GENERAL SURGERY ADMISSION NOTE     HPI:  41yoF PMHx prior umbilical hernia repaired in 2008 (she does not believe there was mesh) now with ~2 weeks of worsening umbilical pain. She presented to the ED on 6/17 with similar symptoms, and vitals, labs at that time were wnl. CT showing 3x6cm fat containing umbilical hernia. No obstructive symptoms. She left with plans for outpt surgical follow up, but returned today because the pain has been persistent. Today, again VSS, labs wnl, and no obstructive symptoms.     CODE STATUS:   No Order     HISTORY:  Patient History     History reviewed. No pertinent past medical history.     History reviewed. No pertinent surgical history.     Social History     Tobacco Use   ? Smoking status: Never Smoker   ? Smokeless tobacco: Never Used   Substance Use Topics   ? Alcohol use: Never       No family history on file.     No Known Allergies    Current Outpatient Medications   Medication Instructions   ? acetaminophen (TYLENOL) 650 mg, oral, Every 6 hours PRN   ? ibuprofen 400 mg, oral, Every 6 hours PRN             REVIEW OF SYSTEMS:   GENERAL: No fevers, chills, weight changes  HEENT: No tinnitus, hearing or vision changes, dysphagia, rhinorrhea, epistaxis  CV: No chest pain, dyspnea, orthopnea, palpitations, pedal edema  PULM: No cough, hemoptysis, wheezing, cold symptoms  GI: As per HPI  GU: No dysuria, hematuria  MSK: No new arthralgias, myalgias  NEURO: No focal weakness, paresthesias, numbness.  DERM: No new rashes  HEME: No easy bleeding/bruising  ID: No recent cold symptoms, recent foreign travel  ENDO: No heat or cold intolerance    PHYSICAL EXAM:   Temp:  [36.1 ?C (97 ?F)] 36.1 ?C (97 ?F)  Pulse:  [68-78] 78  Resp:  [16-18] 18  BP: (117-147)/(61-81) 117/61    GENERAL:  NAD, resting comfortably  HEENT: NCAT, MMM  CARDIAC: NSR on monitor and RRR, radial pulses palpable and symmetric  PULM: No increased work of breathing, no accessory muscle use and comfortable on room air  GI:  Soft, mildly tender around the umbilicus, nondistended, no peritoneal signs, hernia is unable to be palpated  GU: Deferred  EXT: Moving all extremities and Warm and well perfused  NEURO: No focal deficits    LABS:  7.6 \ 12.0 / 247    / 37.9 \     140 106 13 / 91   3.9 27 0.74 \     -9.9   -No results found for requested labs within last 8760 hours.   -No results found for requested labs within last 8760 hours.            IMAGING:  No new imaging results    ASSESSMENT AND PLAN:   50yoF otherwise healthy with fat containing umbilical hernia. Afebrile, HDS, nontoxic appearing, nonperitoneal, no obstructive symptoms, no leukocytosis or left shift. Pt does not want to do outpatient surgery given the extent of her pain.    - Admit to TACS  - OK for regular diet, NPO and fluids at midnight for surgery 6/21  - Analgesia  - VTE ppx    Rubye Beach, MD  General Surgery Resident    Please page Surgical Consult Resident, [phone] for urgent/ emergent consults or questions.The person who  wrote this note may be post-call or in a procedure and contacting them directly using [employer] only may result in a delay of care.

## 2020-08-29 NOTE — ED Triage Notes (Signed)
Pt was seen here in ED on Friday and diagnosed with a hernia. Pt is planned to have surgery for hernia repair. Pt reports worsening pain. Denies nausea/vomiting, diarrhea, fevers or urinary symptoms.

## 2020-08-29 NOTE — ED Provider Notes (Signed)
HPI   Chief Complaint   Patient presents with   ? Hernia   ? Abdominal Pain       The patient is a 50 year old female with past medical history umbilical hernia repair in 2008 with mesh who presents with ongoing abdominal pain. She was seen in the ED on June 17 with abdominal pain and underwent a CT scan and was found to have a fat containing umbilical hernia with some stranding. She notes it is reproducible but states it is out more than it is in.  She is tolerating oral intake.  She denies nausea, vomiting, diarrhea, fevers, chest pain, dyspnea, urinary symptoms.  She states her pain is unchanged but returned to the ED because it has persisted.  Her last dose of oral pain medications was at 10 PM yesterday.      History provided by:  Patient  Language interpreter used: [phone] [place], Telephone [place] interpreter.                  Glasgow Coma Scale Score: 15                                  Patient History   History reviewed. No pertinent past medical history.  History reviewed. No pertinent surgical history.  No family history on file.  Social History     Tobacco Use   ? Smoking status: Never Smoker   ? Smokeless tobacco: Never Used   Substance Use Topics   ? Alcohol use: Never   ? Drug use: Not on file       Review of Systems   Review of Systems   Constitutional: Negative for fever.   Respiratory: Negative for shortness of breath.    Cardiovascular: Negative for chest pain.   Gastrointestinal: Positive for abdominal pain. Negative for nausea and vomiting.   Endocrine: Negative for polyuria.   Genitourinary: Negative for dysuria.   Musculoskeletal: Negative for back pain.   Skin: Negative for rash.   All other systems reviewed and are negative.      Physical Exam   ED Triage Vitals [08/29/20 1232]   Temp Pulse Resp BP   36.1 ?C (97 ?F) 68 18 (!) 147/81      SpO2 Temp Source Heart Rate Source Patient Position   99 % Temporal Monitor Sitting      BP Location FiO2 (%)     Left arm --       Physical  Exam  Vitals and nursing note reviewed.   Constitutional:       General: She is not in acute distress.     Appearance: She is well-developed.   HENT:      Head: Normocephalic and atraumatic.   Eyes:      Conjunctiva/sclera: Conjunctivae normal.   Cardiovascular:      Rate and Rhythm: Normal rate and regular rhythm.      Heart sounds: No murmur heard.  Pulmonary:      Effort: Pulmonary effort is normal. No respiratory distress.      Breath sounds: Normal breath sounds.   Abdominal:      General: Bowel sounds are normal. There is no distension.      Palpations: Abdomen is soft. There is mass (palpable umbilical hernia). There is no pulsatile mass.      Tenderness: There is abdominal tenderness in the periumbilical area.      Hernia:  A hernia is present. Hernia is present in the umbilical area.   Musculoskeletal:      Cervical back: Neck supple.   Skin:     General: Skin is warm and dry.   Neurological:      General: No focal deficit present.      Mental Status: She is alert.   Psychiatric:         Mood and Affect: Mood normal.         Behavior: Behavior normal.       No orders to display       Labs Reviewed   PTT - Abnormal       Result Value    aPTT 38.8 (*)    LIPASE - Normal    Lipase 19     PROTIME-INR - Normal    Protime 13.4      INR 1.10     HCG, QUANTITATIVE, PREGNANCY - Normal    hCG, serum, quantitative <5      Narrative:     Expected Results:  Adult Female: 0-1 mIU/mL  Non-pregnant Female: 0-4 mIU/mL    If pregnancy is expected, suggest repeat in 24-72 hours.    The concentration of HCG rises rapidly during early pregnancy to a level of 5,000 to 200,000 mIU/mL at 10-12 weeks, followed by a slow decline to levels of 1,000 to 50,000 mIU/mL during the 3rd trimester.    The use of this assay to monitor or diagnose patients with cancer or any other condiion unrelated to pregnancy has not been cleared or approved by the FDA or the manufacturer of the assay.   LACTIC ACID, WHOLE BLOOD - Normal    Lactate, Whole  Blood 0.9     COVID-19 PCR TESTING    Narrative:     The following orders were created for panel order COVID-19 PCR Testing (6 hr TAT).  Procedure                               Abnormality         Status                     ---------                               -----------         ------                     Rapid SARS-CoV-2[[id number]]                                                               Please view results for these tests on the individual orders.   CBC WITH DIFFERENTIAL - WAM AND NON-WAM    WBC 7.6      RBC 4.52      Hemoglobin 12.0      Hematocrit 37.9      MCV 83.8      MCH 26.5      MCHC 31.7      RDW-CV 12.8      RDW-SD 38.7      Platelets 247  MPV 12.1      Neutrophil % 63.1      Lymphocyte % 28.7      Monocytes % 6.6      Eosinophils % 0.8      Basophils % 0.4      Immature Granulocytes % 0.4      NRBC % 0.0      Neutrophils Absolute 4.82      Lymphocytes Absolute 2.19      Monocytes Absolute 0.50      Eosinophils Absolute 0.06      Basophils Absolute 0.03      Immature Granulocytes Absolute 0.03      NRBC Absolute 0.00     COMPREHENSIVE METABOLIC PANEL    Sodium 140      Potassium 3.9      Chloride 106      CO2 (Bicarbonate) 27      Anion Gap 7      BUN 13      Creatinine 0.74      eGFRcr 100      Glucose 91      Fasting? Unknown      Calcium 9.9      AST 21      ALT 17      Alkaline phosphatase 83      Protein, total 8.1      Albumin 4.4      Bilirubin, total 0.6     URINALYSIS REFLEX TO CULTURE   RAPID SARS-COV-2       ED Course & MDM   ED Course as of 08/29/20 1542   Mon Aug 29, 2020   1541 Surgery resident Dr. Georgina Quint and attending surgeon Dr. Selena Batten consulted and patient will be admitted to surgical service [KS]      ED Course User Index  [KS] Vicente Masson, MD         Diagnoses as of 08/29/20 1542   Umbilical hernia without obstruction and without gangrene   Abdominal pain, unspecified abdominal location       MDM  Number of Diagnoses or Management Options  Umbilical hernia without  obstruction and without gangrene  Diagnosis management comments: Patient presents with ongoing hernia pain with a known fat-containing umbilical hernia.  Last dose of pain medication was yesterday so will give ketorolac.  She is tolerating oral intake.  Will repeat labs to look for metabolic derangements or leukocytosis.  Will consult surgery to evaluate the hernia.         Vicente Masson, MD  08/29/20 [phone]

## 2020-08-29 NOTE — Anesthesia Pre-Procedure Evaluation (Addendum)
Patient: [name]    Procedure Information     Date: 08/30/20    Procedure: Repair, Hernia, Umbilical, laparoscopic possible open - Laparoscopic possible open    Location: [facility] Operating Room    Surgeons: Maurice Small, MD          [phone] PMHx prior umbilical hernia repaired in 2008 (she does not believe there was mesh) now with ~2 weeks of worsening umbilical pain. She presented to the ED on 6/17 with similar symptoms, and vitals, labs at that time were wnl. CT showing 3x6cm fat containing umbilical hernia. No obstructive symptoms.       History reviewed. No pertinent past medical history.      History reviewed. No pertinent surgical history.      Social History     Tobacco Use   ? Smoking status: Never Smoker   ? Smokeless tobacco: Never Used   Substance Use Topics   ? Alcohol use: Never   ? Drug use: None           Current Facility-Administered Medications:   ?  acetaminophen (Tylenol) tablet 650 mg, 650 mg, oral, q6h PRN **OR** acetaminophen (Tylenol) solution 650 mg, 650 mg, oral, q6h PRN **OR** acetaminophen (Tylenol) suppository 650 mg, 650 mg, rectal, q6h PRN, Rubye Beach, MD  ?  enoxaparin (Lovenox) syringe 40 mg, 40 mg, subcutaneous, Daily, Rubye Beach, MD  ?  HYDROmorphone (Dilaudid) injection 0.4 mg, 0.4 mg, intravenous, q3h PRN, Rubye Beach, MD  ?  ketorolac (Toradol) injection 15 mg, 15 mg, intravenous, q6h PRN, Rubye Beach, MD  ?  lactated Ringer's infusion, 100 mL/hr, intravenous, Continuous, Rubye Beach, MD, Last Rate: 100 mL/hr at 08/30/20 0008, 100 mL/hr at 08/30/20 0008  ?  naloxone (Narcan) injection 0.1 mg, 0.1 mg, intravenous, PRN, Rubye Beach, MD  ?  ondansetron ODT (Zofran-ODT) disintegrating tablet 4 mg, 4 mg, oral, q8h PRN **OR** ondansetron (Zofran) injection 4 mg, 4 mg, intravenous, q8h PRN, Rubye Beach, MD  ?  oxyCODONE (Roxicodone) immediate release tablet 5 mg, 5 mg, oral, q4h PRN **OR** oxyCODONE (Roxicodone) solution 5 mg, 5 mg, oral, q4h  PRN, Rubye Beach, MD    No Known Allergies    Vitals 08/26/2020 08/26/2020 08/29/2020 08/29/2020 08/29/2020 08/29/2020 08/30/2020   Systolic 122 133 599 121 117 137 104   Diastolic 77 84 81 65 61 69 60   Pulse 63 62 68 78 78 59 60   Temp - - 97 - - 98.1 98.2   Resp 16 18 18 16 18 18 15    Height (in) - - - - - 68 -   Weight (lb) - - - - - 191.14 -   SpO2 100 99 99 100 99 100 96   BMI (kg/m2) - - - - - 29.06 kg/m2 -   BSA (m2) - - - - - 2.04 m2 -   Some recent data might be hidden       Labs:     Latest Reference Range & Units 08/29/20 13:45 08/29/20 16:14 08/29/20 16:55   Sodium 135 - 146 mmol/L 140     Potassium 3.6 - 5.2 mmol/L 3.9     Chloride 98 - 110 mmol/L 106     CO2 (Bicarbonate) 20 - 32 mmol/L 27     Anion Gap 3 - 14 mmol/L 7     BUN 6 - 24 mg/dL 13     Lactate, Whole Blood 0.5 - 2.2  mmol/L 0.9     Creatinine 0.55 - 1.30 mg/dL 6.96     eGFRcr >=78 LF/YBO/1.75Z*0 100     Glucose 70 - 139 mg/dL 91     Calcium 8.5 - 25.8 mg/dL 9.9     Albumin 3.2 - 5.0 g/dL 4.4     Protein, total 6.0 - 8.4 g/dL 8.1     Lipase 8 - 60 U/L 19     Aspartate Aminotransferase (AST) 6 - 42 U/L 21     Alanine Aminotransferase (ALT) 0 - 55 U/L 17     Alkaline phosphatase 30 - 130 U/L 83     Bilirubin, total 0.2 - 1.2 mg/dL 0.6     WBC 4.0 - 52.7 K/uL 7.6     RBC 3.70 - 5.20 M/uL 4.52     Hemoglobin 11.0 - 16.0 g/dL 78.2     Hematocrit 42.3 - 47.0 % 37.9     MCV 80.0 - 100.0 fL 83.8     MCH 26.0 - 34.0 pg 26.5     MCHC 31.0 - 37.0 g/dL 53.6     RDW-CV 14.4 - 14.5 % 12.8     RDW-SD 35.0 - 51.0 fL 38.7     NRBC % 0.0 - 0.0 % 0.0     NRBC Absolute 0.00 - 2.00 K/uL 0.00     Platelets 150 - 400 K/uL 247     MPV 9.1 - 12.4 fL 12.1     Neutrophil % % 63.1     Lymphocyte % % 28.7     Monocytes % % 6.6     Eosinophils % % 0.8     Basophils % % 0.4     Immature Granulocytes % % 0.4     Neutrophils Absolute 1.50 - 7.95 K/uL 4.82     Lymphocytes Absolute 0.70 - 4.00 K/uL 2.19     Monocytes Absolute 0.36 - 0.77 K/uL 0.50     Eosinophils Absolute 0.00  - 0.50 K/uL 0.06     Basophils Absolute 0.00 - 0.22 K/uL 0.03     Immature Granulocytes Absolute 0.00 - 0.10 K/uL 0.03     PROTIME 9.7 - 14.0 seconds 13.4     INR See Interpretive Comment  1.10     aPTT 25.7 - 35.7 Seconds 38.8 (H)     hCG, serum, quantitative <=5 mIU/mL <5     SARS-CoV-2 RNA PCR Negative   Negative    Color, Ur Light Yellow, Yellow    Yellow   Clarity, Ur Clear    Clear   Specific Gravity, Ur 1.001 - 1.035    1.009   pH, Ur 5.0, 5.5, 6.0, 6.5, 7.0, 7.5, 8.0    6.5   Protein, Ur Negative    Negative   Glucose, Ur Negative    Negative   Ketones, Ur Negative    Negative   Bilirubin, Ur Negative    Negative   Blood, Ur Negative    Negative   Urobilinogen, Ur 0.2 , 1.0 EU   0.2    Nitrite, Ur Negative    Negative   Leukocyte Esterase, Ur Negative    Trace !   RBC, Ur 0 - 4 /HPF   <1   WBC, Ur 0 - 5 /HPF   13.7 (H)   Squamous Epithelial Cells, Ur Negative, Few, Moderate, Many /HPF   Few   Bacteria, Ur Negative, Few, Moderate, Many    Few   Hyaline Casts, Ur 0 - 8 /  LPF   0.0   (H): Data is abnormally high  !: Data is abnormal    Imaging 08/26/20:    CT abdomen/Pelvis:  IMPRESSION:  1.  Small fat-containing umbilical hernia with mild prominent with mild inflammatory fat stranding within the hernia sac.  2.  No other acute CT findings within the abdomen and pelvis.    CXR:  IMPRESSION:  1.  Small fat-containing umbilical hernia with mild prominent with mild inflammatory fat stranding within the hernia sac.  2.  No other acute CT findings within the abdomen and pelvis.      EKG:  Measurements   Intervals ? ? ? ? ? ? ? ? ? ? ? ? ? ? ?Axis ? ? ? ? ?   Rate: ? ? ? ? 28 ? ? ? ? ? ? ? ? ? ? ? P: ? ? ? ? ? ?42   PR: ? ? ? ? ? 136 ? ? ? ? ? ? ? ? ? ? ?QRS: ? ? ? ? ?-11   QRSD: ? ? ? ? 104 ? ? ? ? ? ? ? ? ? ? ?T: ? ? ? ? ? ?-8   QT: ? ? ? ? ? 408 ? ? ? ? ? ? ? ? ? ? ? ? ? ? ? ? ? ?   QTc: ? ? ? ? ?411 ? ? ? ? ? ? ? ? ? ? ? ? ? ? ? ? ? ?   ?? ? ? ? ? ? ? ? ? ? ? ? ? Interpretive Statements   SINUS RHYTHM   RSR' IN V1  OR V2, RIGHT VCD OR RVH       Physical Exam    Airway  Mallampati: II  TM distance: >3 FB  Mouth opening: >3 FB  Neck ROM: full  Able to protrude mandible     Cardiovascular - normal exam     Dental   Edentulous upper jaw and Edentulous lower jaw   Pulmonary - normal exam     Abdominal - normal exam     General   Alert               Anesthesia Plan    ASA 1   NPO status verified    general     Airway: ETT  Monitoring: standard monitors  Postoperative Pain Control: IV/PO analgesics    Multimodal PONV prophylaxis planned  Essential imaging and labs available and reviewed    Anesthetic plan and risks discussed with patient.         Additional Equipment Requests

## 2020-08-29 NOTE — Care Plan (Signed)
Problem: Adult Inpatient Plan of Care  Goal: Plan of Care Review  Outcome: Ongoing, Progressing   Goal Outcome Evaluation:    Pt admitted to [facility] 4 with abdominal pain. Pt is A+O x4. [place] speaking only, but can make needs known in [place]. On RA. No SOB noted. +Bowel sounds. Abdomen soft and tender. Denies N/V. Continent x2, independent up to bathroom.  NPO at midnight for surgery in the morning. Pt made aware. Pt and daughter updated on POC and in agreement. Tolerating diet at this time. Call bell within reach. Safety maintained. Evelena Leyden, RN    BP 137/69 (BP Location: Right arm, Patient Position: Sitting)   Pulse 59   Temp 36.7 ?C (98.1 ?F) (Oral)   Resp 18   Ht 1.727 m   Wt 86.7 kg   SpO2 100%   BMI 29.06 kg/m?

## 2020-08-29 NOTE — ED Progress Note (Signed)
Provider in Triage Note    50 y.o. female with a PMH of hernia repair in 2008 with mesh presents to the ED with complaints of abdominal pain in the setting of hernia. The patient was seen in the ED on 08/26/20 for abdominal pain. She had a CT scan done which revealed fat-containing hernia with no loops of bowel. The patient was counseled for follow-up with general surgery outpatient of futher management and continuance of care. Since ED visit the patient has been having continued pain despite taking Tylenol and Ibuprofen. Pain worse today and they called the surgery team and was instructed to come to ED for further evaluation. No fevers/chills, N/V/D, or urinary symptoms. Pain is located in the periumbilical region.       Physical Exam: Well-appearing female in no acute distress.  CV: Regular rate and rhythm.  Lungs: Clear to auscultation bilaterally.  GI: Soft, moderate tenderness to the periumbilical region.    I have seen and evaluated this patient in triage.     Marthenia Rolling, PA      Joyice Faster Dasilva, [place]  08/29/20 [phone]

## 2020-08-29 NOTE — ED Notes (Signed)
Assumed care of pt in room A2, pt reports abdominal pain hx of umbilical hernia. IVR placed, labs sent. Surgical MD at bedside for eval. Pt daughter at bedside. Pt resting comfortably on stretcher.      Laqueta Carina, RN  08/29/20 1351

## 2020-08-30 ENCOUNTER — Inpatient Hospital Stay (HOSPITAL_BASED_OUTPATIENT_CLINIC_OR_DEPARTMENT_OTHER): Admitting: Student in an Organized Health Care Education/Training Program

## 2020-08-30 ENCOUNTER — Encounter (HOSPITAL_BASED_OUTPATIENT_CLINIC_OR_DEPARTMENT_OTHER): Admission: EM | Disposition: A | Discharge status: Home / Self Care | Source: Home / Self Care | Attending: Surgery

## 2020-08-30 ENCOUNTER — Encounter (HOSPITAL_BASED_OUTPATIENT_CLINIC_OR_DEPARTMENT_OTHER): Admitting: Surgery

## 2020-08-30 ENCOUNTER — Encounter

## 2020-08-30 LAB — CBC
Hematocrit: 35.7 % (ref 32.0–47.0)
Hemoglobin: 11.4 g/dL (ref 11.0–16.0)
MCH: 27.1 pg (ref 26.0–34.0)
MCHC: 31.9 g/dL (ref 31.0–37.0)
MCV: 85 fL (ref 80.0–100.0)
MPV: 12.4 fL (ref 9.1–12.4)
NRBC %: 0 % (ref 0.0–0.0)
NRBC Absolute: 0 10*3/uL (ref 0.00–2.00)
Platelets: 210 10*3/uL (ref 150–400)
RBC: 4.2 M/uL (ref 3.70–5.20)
RDW-CV: 12.8 % (ref 11.5–14.5)
RDW-SD: 39.5 fL (ref 35.0–51.0)
WBC: 6.5 10*3/uL (ref 4.0–11.0)

## 2020-08-30 SURGERY — Repair, Hernia, Umbilical
Anesthesia: General | Site: Abdomen | Wound class: Class I/ Clean

## 2020-08-30 MED ORDER — famotidine (Pepcid) injection
20 | INTRAVENOUS | Status: DC | PRN
Start: 2020-08-30 — End: 2020-08-30
  Administered 2020-08-30: 13:00:00 20 via INTRAVENOUS

## 2020-08-30 MED ORDER — midazolam (Versed) injection
1 | INTRAMUSCULAR | Status: DC | PRN
Start: 2020-08-30 — End: 2020-08-30
  Administered 2020-08-30: 12:00:00 2 via INTRAVENOUS

## 2020-08-30 MED ORDER — sugammadex (Bridion) injection
100 | INTRAVENOUS | Status: DC | PRN
Start: 2020-08-30 — End: 2020-08-30
  Administered 2020-08-30: 15:00:00 200 via INTRAVENOUS

## 2020-08-30 MED ORDER — ondansetron (Zofran) injection 4 mg
4 | Freq: Once | INTRAMUSCULAR | Status: DC
Start: 2020-08-30 — End: 2020-08-30

## 2020-08-30 MED ORDER — dexMEDEtomidine in NS (Precedex) injection
4 | INTRAVENOUS | Status: DC | PRN
Start: 2020-08-30 — End: 2020-08-30
  Administered 2020-08-30: 12:00:00 20 via INTRAVENOUS

## 2020-08-30 MED ORDER — ceFAZolin (Ancef) injection
1 | INTRAMUSCULAR | Status: DC | PRN
Start: 2020-08-30 — End: 2020-08-30
  Administered 2020-08-30: 13:00:00 2 via INTRAVENOUS

## 2020-08-30 MED ORDER — bupivacaine PF (Marcaine) 0.25 % (2.5 mg/mL) injection
0.25 | INTRAMUSCULAR | Status: DC | PRN
Start: 2020-08-30 — End: 2020-08-30
  Administered 2020-08-30: 15:00:00 8
  Administered 2020-08-30: 13:00:00 10

## 2020-08-30 MED ORDER — haloperidol lactate (Haldol) injection 1 mg
5 | Freq: Once | INTRAMUSCULAR | Status: DC | PRN
Start: 2020-08-30 — End: 2020-08-30

## 2020-08-30 MED ORDER — propofol (Diprivan) injection
10 | INTRAVENOUS | Status: DC | PRN
Start: 2020-08-30 — End: 2020-08-30
  Administered 2020-08-30: 15:00:00 20 via INTRAVENOUS
  Administered 2020-08-30: 15:00:00 30 via INTRAVENOUS
  Administered 2020-08-30: 12:00:00 100 via INTRAVENOUS

## 2020-08-30 MED ORDER — phenylephrine in NS (Neo-Synephrine) syringe
80 | INTRAVENOUS | Status: DC | PRN
Start: 2020-08-30 — End: 2020-08-30
  Administered 2020-08-30: 13:00:00 80 via INTRAVENOUS
  Administered 2020-08-30: 13:00:00 160 via INTRAVENOUS
  Administered 2020-08-30: 15:00:00 80 via INTRAVENOUS

## 2020-08-30 MED ORDER — ondansetron (Zofran) injection
4 | INTRAMUSCULAR | Status: DC | PRN
Start: 2020-08-30 — End: 2020-08-30
  Administered 2020-08-30: 15:00:00 4 via INTRAVENOUS

## 2020-08-30 MED ORDER — ketamine (Ketalar) injection
50 | INTRAMUSCULAR | Status: DC | PRN
Start: 2020-08-30 — End: 2020-08-30
  Administered 2020-08-30: 13:00:00 20 via INTRAVENOUS

## 2020-08-30 MED ORDER — lactated Ringer's infusion
INTRAVENOUS | Status: DC
Start: 2020-08-30 — End: 2020-08-30

## 2020-08-30 MED ORDER — ketorolac (Toradol) injection
30 | INTRAMUSCULAR | Status: DC | PRN
Start: 2020-08-30 — End: 2020-08-30
  Administered 2020-08-30: 15:00:00 15 via INTRAVENOUS

## 2020-08-30 MED ORDER — bupivacaine PF (Marcaine) 0.25 % (2.5 mg/mL) injection  - Omnicell Override Pull
0.25 | INTRAMUSCULAR | Status: AC
Start: 2020-08-30 — End: ?

## 2020-08-30 MED ORDER — fentaNYL (Sublimaze) injection
50 | INTRAMUSCULAR | Status: DC | PRN
Start: 2020-08-30 — End: 2020-08-30
  Administered 2020-08-30: 12:00:00 100 via INTRAVENOUS

## 2020-08-30 MED ORDER — dexAMETHasone (Decadron) injection
4 | INTRAMUSCULAR | Status: DC | PRN
Start: 2020-08-30 — End: 2020-08-30
  Administered 2020-08-30: 13:00:00 10 via INTRAVENOUS

## 2020-08-30 MED ORDER — HYDROmorphone (Dilaudid) injection 0.4 mg
2 | INTRAMUSCULAR | Status: DC | PRN
Start: 2020-08-30 — End: 2020-08-30

## 2020-08-30 MED ORDER — HYDROmorphone (Dilaudid) injection
2 | INTRAMUSCULAR | Status: DC | PRN
Start: 2020-08-30 — End: 2020-08-30
  Administered 2020-08-30: 15:00:00 .5 via INTRAVENOUS
  Administered 2020-08-30: 15:00:00 .4 via INTRAVENOUS
  Administered 2020-08-30: 13:00:00 .6 via INTRAVENOUS
  Administered 2020-08-30: 15:00:00 .5 via INTRAVENOUS

## 2020-08-30 MED ORDER — meperidine (Demerol) injection 12.5 mg
25 | Freq: Once | INTRAMUSCULAR | Status: DC | PRN
Start: 2020-08-30 — End: 2020-08-30

## 2020-08-30 MED ORDER — phenylephrine 20 mg/250 mL (80 mcg/mL) in NS infusion
80 | INTRAVENOUS | Status: DC | PRN
Start: 2020-08-30 — End: 2020-08-30
  Administered 2020-08-30: 13:00:00 .3 via INTRAVENOUS

## 2020-08-30 MED ORDER — fentaNYL (Sublimaze) injection 25 mcg
50 | INTRAMUSCULAR | Status: DC | PRN
Start: 2020-08-30 — End: 2020-08-30
  Administered 2020-08-30 (×3): 25 ug via INTRAVENOUS

## 2020-08-30 MED ORDER — acetaminophen (Tylenol) tablet
325 | ORAL | Status: DC | PRN
Start: 2020-08-30 — End: 2020-08-30
  Administered 2020-08-30: 12:00:00 975 via ORAL

## 2020-08-30 MED ORDER — lidocaine PF (Xylocaine) 20 mg/mL (2 %) injection
20 | INTRAMUSCULAR | Status: DC | PRN
Start: 2020-08-30 — End: 2020-08-30
  Administered 2020-08-30: 12:00:00 20 via INTRAVENOUS

## 2020-08-30 MED ORDER — rocuronium (ZeMuron) injection
10 | INTRAVENOUS | Status: DC | PRN
Start: 2020-08-30 — End: 2020-08-30
  Administered 2020-08-30: 14:00:00 10 via INTRAVENOUS
  Administered 2020-08-30: 12:00:00 50 via INTRAVENOUS
  Administered 2020-08-30 (×2): 20 via INTRAVENOUS

## 2020-08-30 MED ORDER — heparin (porcine) injection
5000 | INTRAMUSCULAR | Status: DC | PRN
Start: 2020-08-30 — End: 2020-08-30
  Administered 2020-08-30: 12:00:00 5000 via SUBCUTANEOUS

## 2020-08-30 MED FILL — BUPIVACAINE (PF) 0.25 % (2.5 MG/ML) INJECTION SOLUTION: 0.25 0.25 % (2.5 mg/mL) | INTRAMUSCULAR | Qty: 30

## 2020-08-30 MED FILL — ACETAMINOPHEN 325 MG TABLET: 325 325 mg | ORAL | Qty: 2

## 2020-08-30 MED FILL — KETOROLAC 15 MG/ML INJECTION SOLUTION: 15 15 mg/mL | INTRAMUSCULAR | Qty: 1

## 2020-08-30 MED FILL — OXYCODONE 5 MG TABLET: 5 5 mg | ORAL | Qty: 1

## 2020-08-30 MED FILL — FENTANYL (PF) 50 MCG/ML INJECTION SOLUTION: 50 50 mcg/mL | INTRAMUSCULAR | Qty: 2

## 2020-08-30 MED FILL — ONDANSETRON HCL (PF) 4 MG/2 ML INJECTION SOLUTION: 4 4 mg/2 mL | INTRAMUSCULAR | Qty: 2

## 2020-08-30 SURGICAL SUPPLY — 26 items
ADHESIVE DERMABOND ADVANCED (Dressing) ×1
APPLICATOR CHLOROPREP 26ML HI (Prep) ×1
DEVICE PUNCTURE CLOSURE 14G (Surgical Supply General) ×1
DRAPE GEN ENDOSCOPY TIBURON (Drape) ×1
ELECTRODE RETURN DUAL PLATE W/ (Pt Monitoring Supply) ×1
GLOVE POLYISOPRENE ORTHO SZ6.0 (Glove) ×1
GLOVE POLYISOPRENE SZ6.0 ST PF (Glove) ×1
GOWN SURG REINFORCED XLG LVL3 (Gown) ×2
MESH PARIETEX COMPOSITE 4.8 RN (Mesh) ×1 IMPLANT
NEEDLE ENDOPATH INSUF 120MM (Needle And Syringe) ×1
NO LONGER USED USE ITEM 959826 (Medical Supply General) ×1
PACK GENERAL CUSTOM (Custom Pack) ×1
PENCIL EZ CLEAN BUTTON SWITCH (Advanced Energy) ×1
POSITIONER DONUT HEADREST 9IN (Surgical Supply General) ×1
SCISSOR ENDOPATH 5MM CVD W/UNI (Cutting/Coagulating Electrode) ×1
SEALER ENSEAL X1 CVD JAW 37CM (Skin Closure Adhesives) ×1
SLEEVE OPTIVIEW UNIV 5X100 (Laparoscopic Supply) ×3
SLEEVE SCD COMFORT KNEE MED NS (Medical Supply General) ×1
SUTURE MONOCRYL+ 4 0 18IN  PS2 (Suture) ×2
SUTURE PDS+ 0 27IN CT1 VIOL (Suture) ×5
SUTURE PROLENE 0 30IN CT1 BLU (Suture) ×2
SYSTEM CLEARIFY VISUALIZATION (Endoscopy Supply) ×1
TACKER ABSORBATACK 5MM HERNIA (Endoscopy Supply) ×2
TROCAR OPTIVIEW 12X100 BLADELE (Laparoscopic Supply) ×1
TROCAR OPTIVIEW 5X100 BLADELES (Laparoscopic Supply) ×1
TUBING PNEUMOSURE HI FLO SET (Laparoscopic Supply) ×1

## 2020-08-30 NOTE — Anesthesia Post-Procedure Evaluation (Addendum)
Patient: [name]    Procedure Summary     Date: 08/30/20 Room / Location: [facility] OR 03 / [facility] Operating Room    Anesthesia Start: 0821 Anesthesia Stop: 1119    Procedure: laparoscopic INCISIONAL HERNIA REPAIR WITH MESH (N/A Abdomen) Diagnosis:       Umbilical hernia without obstruction and without gangrene      (Umbilical hernia without obstruction and without gangrene [K42.9])    Surgeons: Maurice Small, MD Responsible Provider: Leeanne Mannan, MD    Anesthesia Type: general ASA Status: 1          Anesthesia Type: general    Vitals Value Taken Time   BP 128/66 08/30/20 1116   Temp 36.9 ?C (98.4 ?F) 08/30/20 1115   Pulse 82 08/30/20 1118   Resp 12 08/30/20 1118   SpO2 99 % 08/30/20 1118   Vitals shown include unvalidated device data.    Anesthesia Post Evaluation Note:    Patient location during evaluation: PACU  Patient participation: able to participate  Level of consciousness: awake  Cardiovascular and Hydration status: vital signs within acceptable range  Respiratory Status Stable and Airway Patent: yes  Nausea and Vomiting Control Satisfactory: yes  Pain score: 0  Pain management: adequate  Multimodal analgesia pain management approach     Aldrete score reviewed: yes  Vitals reviewed: yes  Unplanned ICU Admission: noPatient has recovered from anesthesia and has returned to baseline mental status, cardiovascular and respiratory function. Pain, nausea, and vomiting are adequately controlled and the patient is adequately hydrated and appropriate for discharge from PACU?: yes      There were no known complications for this encounter.

## 2020-08-30 NOTE — Discharge Instructions (Addendum)
Discharge Instructions:    Follow up:   - Trauma & Acute Care Surgery: follow up appointment on 09/13/20 at11:30am for wound check. Location: [address] [place] [facility], [facility] [employer]. If you have questions or need to reschedule please call [phone]   - Primary Care: Please follow up with you PCP in 2 weeks for continuity of care   - Do not hesitate to call with questions or concerns. Return to your nearest emergency department in the event of any of the following: chest pain or pressure, shortness of breath, fever/chills, a sudden decrease in the amount of urine output you have, nausea/vomiting/diarrhea not responsive to medications, or any changes in neurological status (such as headaches, dizziness, blurry vision, or numbness/tingling in your hands or feet).    Activity/Restrictions:   - Continue home medications  - Regular diet as tolerated  - Full weight bearing as tolerated, but no heavy lifting (i.e., no more than a gallon of milk in either hand) for 6 weeks.  - Monitor incision for signs of infection. Any redness, swelling, or drainage from your incision should be reported to your surgeon.  - You may shower, but do not submerge your surgical wound in water for at least 10 days following your operation.    Pain Management:  - Tylenol 650 mg every 6 hours as needed for pain. This is 2- 325mg  regular strength Tylenol. If you have extra strength Tylenol, please only take ONE per dose.  - Ibuprofen 400 mg every 6 hours as needed for pain.   - For better pain coverage, you can take both the Tylenol & Ibuprofen on an alternating schedule. You will take one dose of pain medication every three hours:   - Start by taking 650 mg of Tylenol (2 pills of 325 mg)  - 3 hours later take 400 mg of Motrin   - 3 hours after taking the Motrin take 650 mg of Tylenol  - 3 hours after that take 400 mg of Motrin  - IMPORTANT: Do not exceed more than 4000mg  of Tylenol or 3200mg  of Motrin in a 24-hour period.  -  Oxycodone: For breakthrough pain, you can take oxycodone as needed. If your pain is not well-controlled on the other medications, you can take 1 oxycodone every 6 hours.  Do not drive or operate machinery while taking narcotic pain medication.     Contact your care team if you notice:  - Fever or chills  - Nausea and /or vomiting greater than 3 time or for longer than 24 hours, not relieved with anti-nausea medication  - Diarrhea in large quantity or more than 3-4 bowel movements in 24 hours  - Constipation (no bowel movement in 3 days or longer)  - Shortness of breath               - Chest pain  - Severe weakness or tiredness   - Severe pain that pain medication does not control

## 2020-08-30 NOTE — Procedures (Signed)
GENERAL SURGERY POSTOP CHECK     PROCEDURE(S):  No surgery found    SUBJECTIVE:  The patient is participatory in interview. No reported nausea/vomiting/chest pain/shortness of breath/or subjective fever or chills. Pain is adequately controlled on current pain regimen.She has not had any PO.  Did not void yet.  She is unsure if she has passed gas yet.  Not ambulating.     PHYSICAL EXAM:   Temp:  [36.6 ?C (97.9 ?F)-37 ?C (98.6 ?F)] 36.9 ?C (98.4 ?F)  Pulse:  [59-89] 72  Resp:  [10-28] 10  BP: (100-143)/(51-74) 100/51    GENERAL:  NAD, resting comfortably  HEENT: NCAT, MMM  CARDIAC: RRR, radial pulses palpable and symmetric  PULM: No increased work of breathing, no accessory muscle use and comfortable on room air  GI: Soft, with appropraiet tenderness. Surgical incisions are clean, dry and intact.   GU: Deferred  EXT: Moving all extremities and Warm and well perfused  NEURO: No focal deficits    LABS:    6.5 \ 11.4 / 210    / 35.7 \     140 106 13 / 91   3.9 27 0.74 \     -9.9   -No results found for requested labs within last 8760 hours.   -No results found for requested labs within last 8760 hours.            IMAGING:  No new imaging results    ASSESSMENT AND PLAN:   50yoF otherwise healthy with fat containing umbilical hernia s/p umbilical hernia repair on 6/21. Afebrile, HDS, nontoxic appearing, nonperitoneal, no obstructive symptoms, no leukocytosis or left shift. Vital signs stable. Pt currently recovering in pacu, awaiting bed on floor.     #Status post umbilical hernia repair on 6/21    - Multimodal pain control   - Tylenol 650 q6 and Ibuprofen 600mg  q6hr alternating     - Alternating cold compresses prn     - Adult diet Regular    - Zofran PRN for nausea    - LR@ 100 cc/hr until patient taking PO    - No indication for continued antibiotic therapy    - DVT ppx   - Lovenox 40 daily    - Home medications    - Early ambulation    - No lifting over 20 lbs for 4 weeks    - Outpatient follow up in 2  weeks    Webb Laws, MD  General Surgery Resident    Please page Trauma Surgery, [phone] for urgent/ emergent consults or questions.The person who wrote this note may be post-call or in a procedure and contacting them directly using [employer] only may result in a delay of care.

## 2020-08-30 NOTE — Addendum Note (Signed)
Addendum  created 08/30/20 1125 by Gaston Islam, DO    Clinical Note Signed

## 2020-08-30 NOTE — Op Note (Signed)
Patient speaks [place]. Preop assessment completed with use of interpretor screen # [phone]

## 2020-08-30 NOTE — Progress Notes (Signed)
08/30/2020 3:27 PM    Patient and plan discussed with medical team.    Reason for admission: Hernia repair    Plan:S/p OR for hernia repair 6/22    Insurance:[employer] PPO    Pharmacy:[facility] [address] [place]    Lives with:son and daughterEloisa Northern [phone]    Baseline functionality:inderpendent    Current functionality: decreased d/t pain    Progress Note/Discharge Plan:Patient is s/p hernia repair today. Pt is [place] speaking and requested that I speak with her daughter Eloisa Northern  Pts daughter was called and provided intake info.Plan is for discharge to home. No services late today or tomorrow 6/23   RN Case Manager to follow for changes to discharge plan or needs.  Please feel free to contact case management with any questions.    Judi Saa, RN

## 2020-08-30 NOTE — Op Note (Signed)
laparoscopic INCISIONAL HERNIA REPAIR WITH MESH Operative Note     Date: 08/30/2020  Location: [facility] OR    Name: [name], DOB: 21-Jan-1971, MRN: [id number]    Diagnosis  Pre-op Diagnosis     * Umbilical hernia without obstruction and without gangrene [K42.9] Post-op Diagnosis     * Umbilical hernia without obstruction and without gangrene [K42.9]     Preop diagnosis is incisional hernia without obstruction or gangrene, containing incarcerated omentum.    Postoperative diagnosis is the same.    Procedures    * laparoscopic INCISIONAL HERNIA REPAIR WITH MESH  Laparoscopic IPOM (intraperitoenal onlay mesh) for incisional hernia    Surgeons      * Advay Volante Cho Damira Kem - Primary   [place] Ricard PGY3  Alphonse Guild PGY 2    Procedure Summary  Anesthesia: General  ASA: I  Estimated Blood Loss: Minimal  Total IV Fluids: 1300 mL  UOP 550 cc  Drains:   [REMOVED] Urethral Catheter Latex 16 Fr. (Removed)   Site Assessment Clean;Skin intact 08/30/20 0936   Collection Container Standard drainage bag 08/30/20 0936       Implants     Type Name Action Serial No.      Mesh MESH PARIETEX COMPOSITE 4.8 RN - SN/A - [id number] Implanted N/A         Staff:   Circulator: Sophia Myrin  Relief Circulator: Vella Kohler, RN  Scrub Person: Kym Groom    Indications: [name] is an 50 y.o. female who is having surgery for fat-containing incisional hernia.  She has a history of umbilical hernia repair without mesh.  Hernia reoccurred at the site.  CT imaging showed fat-containing hernia.  She was admitted for an urgent repair as this hernia was causing significant discomfort and pain, causing multiple trips to the emergency room in 1 week.    Procedure Details:  The patient was seen in the preoperative area. The risks, benefits, complications, treatment options, non-operative alternatives, expected recovery and outcomes were discussed with the patient. The possibilities of reaction to medication, pulmonary aspiration, injury to surrounding  structures, bleeding, recurrent infection, the need for additional procedures, failure to diagnose a condition, and creating a complication requiring transfusion or operation were discussed with the patient.  I also discussed the use of mesh, which places a small but definitive risk for mesh infection. The patient concurred with the proposed plan, giving informed consent.? The site of surgery was properly noted/marked if necessary per policy. The patient has been actively warmed in preoperative area. Preoperative antibiotics have been ordered and given within 1 hours of incision. Venous thrombosis prophylaxis have been ordered including bilateral sequential compression devices and chemical prophylaxis     Patient was taken to the operating room and placed in supine position in the operating room table.  She underwent general anesthesia and endotracheal intubation without concerns.  She was secured to the OR bed and all pressure points were padded.  Both arms were tucked.  OG and a Foley was placed.  A surgical safety checklist was performed with all members of the team present.    Her abdomen was prepped and draped in the usual sterile fashion.  Due to her thick abdominal adipose tissue, I elected to enter the peritoneum via varus technique.  A small incision was made in the Palmer's point.  A Veress needle was gently inserted and advanced, with caution taken to appreciate the slight resistance from fascial and peritoneal penetration of the needle.  Saline drop  test was confirmatory for peritoneal entry.  She was insufflated through the Veress needle and tolerated this well.  Using a clear Optiview 5 mm trocar and a 5 mm scope, the Veress needle was removed and the trocar was advanced to the peritoneum.  The laparoscope was inserted confirming no injuries to the underlying structures.  4 quadrants were surveyed which were unremarkable except for a 2 x 2 centimeter incisional hernia containing a loop of omentum.   With manual pressure and peritoneal retraction, this omentum was easily reduced back into the its anatomic position.    Under direct visualization we inserted the remaining trochars under direct visualization.  First 2 5 mm trochars were inserted in the left upper lateral and left lower lateral quadrant.  A 12 mm trocar was inserted in the left mid lateral abdomen.  Care was taken not to injure nearby bowel especially the descending colon.     First, attention was taken to primarily close the incisional hernia defect.  The hernia measured 2 x 2 cm in size.  We decided to do this with extracorporeal sutures.  A small stab incision was made just above the hernia defect.  A Carter-Thomason needle was used to introduce 0 PDS sutures in order to place an extracorporeal interrupted sutures to close the defect.  Once the sutures were placed, pneumoperitoneum was decreased to 8 mmHg and the sutures were tightened and tied extracorporeally to approximate the fascial defect under direct laparoscopic visualization.    Then a 12 x 12 cm composite mesh was prepared.  I marked the 4 quadrants with a marker to guide mesh orientation.  2-0 Prolene sutures were placed one centrally and 4 in each quadrant.  The mesh was then rolled up and inserted into the abdomen through the 12 mm trocar.  The Leverne Humbles was directly inserted into the peritoneum from above the hernia defect.  The centrally placed suture was then grasped with the Leverne Humbles and pulled up so that the mesh was approximated against the anterior abdominal wall.  The 4 sutures in each quadrant was similarly secured to the anterior abdominal wall using the Leverne Humbles to place transfascial anchoring sutures.  Once the mesh was in place against the anterior abdominal wall, absorbable tackers were used to circumferentially secure the mesh to the peritoneum, ensuring that there were no gaps remaining for potential internal hernia between the mesh and the  abdominal wall.  An inner area of the mesh was circumferentially secured with the tacks as well.    Hemostasis was confirmed in our surgical site.  The remaining bowel appeared normal and in anatomical position.  The 12 mm trocar fascia was closed with transfascial figure of 8-0 PDS sutures using the Leverne Humbles under direct visualization.  All other 5 mm ports were removed under direct visualization.  Her pneumoperitoneum was reversed.  Skin sites were approximated using 4-0 Monocryl and Dermabond.  Local anesthetic was administered along the trocar insertion sites.    All surgical instrument and needle counts were correct.  Patient tolerated the procedure well and was transferred to PACU in stable condition.  I was present for the entire procedure.      Findings:   1. Primary closure of incisional hernia  2. Placement of intraperitoneal mesh    Complications:  None; patient tolerated the procedure well.     Disposition: PACU - hemodynamically stable.  Condition: stable    Matthe Sloane [employer]  Phone Number: [phone]

## 2020-08-30 NOTE — Hospital Course (Addendum)
50yoF PMHx prior umbilical hernia repaired in 2008 (she does not believe there was mesh)  who presented with ~2 weeks of worsening umbilical pain. She previously presented to the ED on 6/17 with similar symptoms. Vitals and labs at that time were wnl. CT demonstrated a 3x6cm fat containing umbilical hernia. No obstructive symptoms. She left with plans for outpt surgical follow up, but returned on 6/20 because the pain had been persistent. VSS, labs wnl, and no obstructive symptoms. Patient underwent uneventful laparoscopic hernia repair on 6/21. Her postoperative course was uneventful, with pain controlled with tylenol, ibuprofen and cold compresses. She did require some oxycodone within 24 hours post op for break through pain, but has been otherwise well controlled. She was instructed to not lift over 20 lbs for 6 weeks, but could freely ambulate. She can shower after 48 hours, but is asked to not submerge the incisions. Patient was discharged on 6/22.    She will follow up in 2 weeks in clinic.

## 2020-08-30 NOTE — Anesthesia Procedure Notes (Signed)
Airway    Date/Time: 08/30/2020 8:27 AM    Location:  OR  Urgency:  Elective  Difficult Airway: No    Induction Technique:  Intravenous  RSI: No    Cricoid pressure: No    Staff:     Anesthesiologist:  Leeanne Mannan, MD    Resident/CRNA:  Myna Hidalgo, CRNA    Performed by:  Resident/CRNA  Indications and Patient Condition:     Indications for Airway Management:  Anesthesia    Preoxygenated: Yes      Patient Position:  Sniffing    Mask Ventilation:  Easy mask  Final Airway Details:     Final Airway Type:  Endotracheal airway    Final Endotracheal Airway:  ETT    Cuffed: Yes      Stylet: Yes      Technique Used for Successful ETT Placement:  Video laryngoscopy    Insertion Site:  Oral    Blade Type:  McGrath    Laryngoscope Blade/Video laryngoscope Blade Size:  3    ETT Size (mm):  7.0    Measured from:  Gums    ETT Depth (cm):  21    Placement Verified by: auscultation and capnometry      Cormack-Lehane Classification:  Grade I - full view of glottis    Number of Attempts at Approach:  1

## 2020-08-30 NOTE — Progress Notes (Addendum)
GENERAL SURGERY PROGRESS NOTE     SUBJECTIVE:  Patient reports some diffuse abdominal pain this morning that is unchanged from yesterday. She denies any N/V/D.     PROCEDURE(S):  0 days from umbilical hernia repair    DIAGNOSIS:   Umbilical hernia    PHYSICAL EXAM:   Temp:  [36.1 ?C (97 ?F)-37 ?C (98.6 ?F)] 37 ?C (98.6 ?F)  Pulse:  [59-80] 80  Resp:  [15-18] 16  BP: (104-147)/(60-81) 143/64    GENERAL:  NAD, resting comfortably  HEENT: NCAT, MMM  CARDIAC: NSR on monitor and RRR, radial pulses palpable and symmetric  PULM: No increased work of breathing, no accessory muscle use and comfortable on room air  GI: Soft, mildly tender around the umbilicus, nondistended, no peritoneal signs, hernia is unable to be palpated  GU: Deferred  EXT: Moving all extremities and Warm and well perfused  NEURO: No focal deficits    Objective   LABS:  6.5 \ 11.4 / 210    / 35.7 \     140 106 13 / 91   3.9 27 0.74 \     -9.9              KEY IMAGING:  CXR:   No acute pulmonary process.    CT Abdomen/Pelvis:   1.  Small fat-containing umbilical hernia with mild prominent with mild inflammatory fat stranding within the hernia sac.  2.  No other acute CT findings within the abdomen and pelvis.    ASSESSMENT AND PLAN:   50yoF otherwise healthy with fat containing umbilical hernia. Afebrile, HDS, nontoxic appearing, nonperitoneal, no obstructive symptoms, no leukocytosis or left shift. Pt does not want to do outpatient surgery given the extent of her pain.  ?  - Regular diet, NPO and fluids at midnight for surgery 6/21  - Analgesia  - VTE ppx  -Plan for OR today, 6/21    Webb Laws, MD  General Surgery Resident    Please page Trauma Surgery, [phone] for urgent/ emergent consults or questions.The person who wrote this note may be post-call or in a procedure and contacting them directly using Tiger Text only may result in a delay of care.

## 2020-08-30 NOTE — Brief Op Note (Signed)
Date: 08/29/2020 - 08/30/2020  Location: [facility] OR    Name: Lynna Zamorano, DOB: 1970-10-29, MRN: [id number]    Diagnosis  Pre-op Diagnosis     * Umbilical hernia without obstruction and without gangrene [K42.9] Post-op Diagnosis     * Umbilical hernia without obstruction and without gangrene [K42.9]     Procedures    * laparoscopic INCISIONAL HERNIA REPAIR WITH MESH    Surgeons      * Woon Janus Molder - Primary    Procedure Summary  Anesthesia: General  ASA: I  Estimated Blood Loss: 5cc  Total IV Fluids: 1300 mL  UOP: 550cc  Drains:   Urethral Catheter Latex 16 Fr. (Active)   Site Assessment Clean;Skin intact 08/30/20 0936   Collection Container Standard drainage bag 08/30/20 0936       Implants     Type Name Action Serial No.      Mesh MESH PARIETEX COMPOSITE 4.8 RN - SN/A - [id number] Implanted N/A         Staff:   Circulator: Sophia Myrin  Relief Circulator: Vella Kohler, RN  Scrub Person: Kym Groom    Indications: [name] is an 50 y.o. female who is having surgery for Umbilical hernia without obstruction and without gangrene [K42.9].      Findings: Reducible, fat containing umbilical hernia. Intraperitoneal mesh placed with 5cm coverage surrounding the defect.    Complications:  None; patient tolerated the procedure well.     Disposition: PACU - hemodynamically stable.  Condition: stable  Specimens Collected: No specimens collected during this procedure.  Attending Attestation: I was present and scrubbed for the entire procedure.    [employer]  Phone Number: [phone]

## 2020-08-30 NOTE — Care Plan (Signed)
Goal Outcome Evaluation:plan OR today, NPO after MN, IV LR@100cc /hr via left PIV. No c/o pain, no N/V,  OOB with minimal assist to bathroom, steady gait.  Instructed in use of IS, need to use postop, able to use to 1000.

## 2020-08-30 NOTE — Care Plan (Signed)
Goal Outcome Evaluation:  Plan of Care Reviewed With: [name], daughter, family  Progress: improving  Outcome Evaluation: Pt back to N4 s/p lap umbilical hernia repair, A&Ox4, VSS, resting comfortbly in bed, neuros intact.  HR regular, lungs are clear, denies CP/palpitations and SOB, pain managed with meds PRN/as ordered, LR @ 100cc/hr, pt met DTV @ 5pm with 3000cc amber urine, IVF still running.  Pt given x1 dose of zofran for intermittent nausea, no vomiting noted.  Pt did attempt to eat some soup family brought from [employer] with little success, even after nausea medication given.  Hypoactive BS present not passing gas yet, OOB with x1 assist with IV pole to BR.  x7 lap incisions are CDI closed with dermabond/steristrips all CDI.  Bed alarm on, bed locked in lowest position, call bell within reach. safety maintained.        Problem: Adult Inpatient Plan of Care  Goal: Plan of Care Review  Outcome: Ongoing, Progressing  Flowsheets (Taken 08/30/2020 2051)  Progress: improving  Plan of Care Reviewed With:   [name]   daughter   family  Outcome Evaluation: Pt back to N4 s/p lap umbilical hernia repair, A&Ox4, VSS, resting comfortbly in bed, neuros intact.  HR regular, lungs are clear, denies CP/palpitations and SOB, pain managed with meds PRN/as ordered, LR @ 100cc/hr, pt met DTV @ 5pm with 3000cc amber urine, IVF still running.  Pt given x1 dose of zofran for intermittent nausea, no vomiting noted.  Pt did attempt to eat some soup family brought from [employer] with little success, even after nausea medication given.  Hypoactive BS present not passing gas yet, OOB with x1 assist with IV pole to BR.  x7 lap incisions are CDI closed with dermabond/steristrips all CDI.  Bed alarm on, bed locked in lowest position, call bell within reach. safety maintained.  Goal: [name]-Specific Goal (Individualized)  Outcome: Ongoing, Progressing  Goal: Absence of Hospital-Acquired Illness or Injury  Outcome: Ongoing, Progressing  Goal:  Optimal Comfort and Wellbeing  Outcome: Ongoing, Progressing  Intervention: Monitor Pain and Promote Comfort  Recent Flowsheet Documentation  Taken 08/30/2020 1950 by Ludwig Clarks, RN  Pain Management Interventions:   care clustered   medication offered   pain management plan reviewed with [name]/caregiver   pillow support provided   quiet environment facilitated   relaxation techniques promoted  Taken 08/30/2020 1658 by Ludwig Clarks, RN  Pain Management Interventions:   pain management plan reviewed with [name]/caregiver   relaxation techniques promoted   quiet environment facilitated  Taken 08/30/2020 1603 by Ludwig Clarks, RN  Pain Management Interventions:   care clustered   medication offered   pain management plan reviewed with [name]/caregiver   position adjusted   pillow support provided   quiet environment facilitated   relaxation techniques promoted   warm blanket provided  Goal: Readiness for Transition of Care  Outcome: Ongoing, Progressing     Problem: Pain Acute  Goal: Acceptable Pain Control and Functional Ability  Outcome: Ongoing, Progressing  Intervention: Develop Pain Management Plan  Recent Flowsheet Documentation  Taken 08/30/2020 1950 by Ludwig Clarks, RN  Pain Management Interventions:   care clustered   medication offered   pain management plan reviewed with [name]/caregiver   pillow support provided   quiet environment facilitated   relaxation techniques promoted  Taken 08/30/2020 1658 by Ludwig Clarks, RN  Pain Management Interventions:   pain management plan reviewed with [name]/caregiver   relaxation techniques promoted   quiet environment facilitated  Taken 08/30/2020 1603  by Ludwig Clarks, RN  Pain Management Interventions:   care clustered   medication offered   pain management plan reviewed with [name]/caregiver   position adjusted   pillow support provided   quiet environment facilitated   relaxation techniques promoted   warm blanket provided     Problem: Nausea and  Vomiting  Goal: Fluid and Electrolyte Balance  Outcome: Ongoing, Progressing     Problem: Oral Intake Inadequate  Goal: Improved Oral Intake  Outcome: Ongoing, Progressing

## 2020-08-31 ENCOUNTER — Other Ambulatory Visit

## 2020-08-31 ENCOUNTER — Encounter (HOSPITAL_BASED_OUTPATIENT_CLINIC_OR_DEPARTMENT_OTHER)

## 2020-08-31 MED ORDER — acetaminophen (Tylenol) tablet 650 mg
325 | Freq: Four times a day (QID) | ORAL | Status: DC
Start: 2020-08-31 — End: 2020-08-31
  Administered 2020-08-31: 12:00:00 650 mg via ORAL

## 2020-08-31 MED ORDER — oxyCODONE (Roxicodone) 5 mg immediate release tablet
5 | ORAL_TABLET | Freq: Four times a day (QID) | ORAL | 0 refills | 8.00000 days | Status: AC | PRN
Start: 2020-08-31 — End: ?
  Filled 2020-08-31: qty 10, 3d supply, fill #0

## 2020-08-31 MED ORDER — ibuprofen 400 mg tablet
400 | ORAL_TABLET | Freq: Four times a day (QID) | ORAL | 0 refills | Status: AC
Start: 2020-08-31 — End: 2020-09-10

## 2020-08-31 MED ORDER — acetaminophen (Tylenol) 325 mg tablet
325 | ORAL_TABLET | Freq: Four times a day (QID) | ORAL | 0 refills | 8.00000 days | Status: AC
Start: 2020-08-31 — End: 2020-09-10
  Filled 2020-08-31: qty 80, 10d supply, fill #0

## 2020-08-31 MED ORDER — lactated Ringer's infusion
INTRAVENOUS | Status: DC
Start: 2020-08-31 — End: 2020-08-31

## 2020-08-31 MED ORDER — ibuprofen tablet 400 mg
400 | Freq: Four times a day (QID) | ORAL | Status: DC
Start: 2020-08-31 — End: 2020-08-31
  Administered 2020-08-31: 14:00:00 400 mg via ORAL

## 2020-08-31 MED FILL — ACETAMINOPHEN 325 MG TABLET: 325 325 mg | ORAL | Qty: 2

## 2020-08-31 MED FILL — OXYCODONE 5 MG TABLET: 5 5 mg | ORAL | Qty: 1

## 2020-08-31 MED FILL — IBUPROFEN 400 MG TABLET: 400 400 mg | ORAL | Qty: 1

## 2020-08-31 MED FILL — ENOXAPARIN 40 MG/0.4 ML SUBCUTANEOUS SYRINGE: 40 40 mg/0.4 mL | SUBCUTANEOUS | Qty: 0.4

## 2020-08-31 MED FILL — KETOROLAC 15 MG/ML INJECTION SOLUTION: 15 15 mg/mL | INTRAMUSCULAR | Qty: 1

## 2020-08-31 NOTE — Nursing Note (Signed)
Discharge note:Pt. Discharged home without services.Meds delivered to pt. Prior to discharge.Medicated with tylenol and motrin with good effect.oob with min assist to br with steady gait.Pt. tolerating reg diet.

## 2020-08-31 NOTE — Discharge Summary (Signed)
Admit Date: 08/29/20    Discharge Date: 08/31/20    Discharge Diagnosis  Incisional hernia without obstruction and without gangrene    Hospital Course  50yoF PMHx prior umbilical hernia repaired in 2008 (she does not believe there was mesh)  who presented with ~2 weeks of worsening umbilical pain. She previously presented to the ED on 6/17 with similar symptoms. Vitals and labs at that time were wnl. CT demonstrated a 3x6cm fat containing umbilical hernia. No obstructive symptoms. She left with plans for outpt surgical follow up, but returned on 6/20 because the pain had been persistent. VSS, labs wnl, and no obstructive symptoms. Patient underwent uneventful laparoscopic hernia repair on 6/21. Her postoperative course was uneventful, with pain controlled with tylenol, ibuprofen and cold compresses. She did require some oxycodone within 24 hours post op for break through pain, but has been otherwise well controlled. She was instructed to not lift over 20 lbs for 6 weeks, but could freely ambulate. She can shower after 48 hours, but is asked to not submerge the incisions. Patient was discharged on 6/22.    She will follow up in 2 weeks in clinic.       Outpatient Follow-Up  Future Appointments   Date Time Provider Department Center   09/13/2020 11:30 AM Vertell Novak Barrett, PA [facility]   12/07/2020 10:00 AM Isha Esmond Harps, MD [facility]

## 2020-08-31 NOTE — Progress Notes (Addendum)
GENERAL SURGERY PROGRESS NOTE     SUBJECTIVE:  Patient reports feeling well this morning, with her pain well controlled with current regimen. She denies any N/V/D. She has been ambulating without issue.     PROCEDURE(S):   1 day from Laparoscopic IPOM (intraperitoenal onlay mesh) repair of an incisional hernia      DIAGNOSIS:   Incisional hernia    PHYSICAL EXAM:   Temp:  [36.4 ?C (97.5 ?F)-37.2 ?C (98.9 ?F)] 37.2 ?C (98.9 ?F)  Pulse:  [58-89] 85  Resp:  [9-28] 16  BP: (91-131)/(49-75) 116/55    GENERAL:  NAD, resting comfortably  HEENT: NCAT, MMM  CARDIAC: NSR on monitor and RRR, radial pulses palpable and symmetric  PULM: No increased work of breathing, no accessory muscle use and comfortable on room air  GI: Soft, with appropraiet tenderness. Surgical incisions are clean, dry and intact.   GU: Deferred  EXT: Moving all extremities and Warm and well perfused  NEURO: No focal deficits    Objective   LABS:  6.5 \ 11.4 / 210    / 35.7 \     140 106 13 / 91   3.9 27 0.74 \     -9.9              KEY IMAGING:  No new imaging     ASSESSMENT AND PLAN:   50yoF otherwise healthy that presented with fat containing umbilical hernia. Afebrile, HDS, nontoxic appearing, nonperitoneal, no obstructive symptoms, no leukocytosis or left shift. Pt underwent laparoscopic IPOM (intraperitoenal onlay mesh) repair of an incisional hernia on 6/21, with an uneventful recovery overnight.     ?  - Analgesia  - VTE ppx  - Plan for discharge today (6/22)    Webb Laws, MD  General Surgery Resident    Please page Trauma Surgery, [phone] for urgent/ emergent consults or questions.The person who wrote this note may be post-call or in a procedure and contacting them directly using [employer] Text only may result in a delay of care.

## 2020-08-31 NOTE — Progress Notes (Signed)
08/31/2020  10:15 AM    Care Management Discharge Note  Patient has been medically cleared for discharge.  Plan is for pt to be discharged to home with no services. Daughter Malica to provide transportation to home.Patient and family in agreement with plan  Judi Saa, RN

## 2020-09-13 ENCOUNTER — Encounter (HOSPITAL_BASED_OUTPATIENT_CLINIC_OR_DEPARTMENT_OTHER): Admitting: Physician Assistant

## 2020-09-13 ENCOUNTER — Ambulatory Visit
Admit: 2020-09-13 | Discharge: 2020-09-13 | Payer: PRIVATE HEALTH INSURANCE | Attending: Physician Assistant | Primary: Geriatric Medicine

## 2020-09-13 ENCOUNTER — Other Ambulatory Visit

## 2020-09-13 VITALS — BP 114/62 | HR 66 | Temp 97.9°F | Resp 16 | Wt 180.0 lb

## 2020-09-13 DIAGNOSIS — Z9889 Other specified postprocedural states: Secondary | ICD-10-CM

## 2020-09-13 NOTE — Progress Notes (Signed)
 Trauma & Acute Care Surgery Clinic Note:     History of Present Illness:   [name] is a 50 year old female with a past medical history significant for prior umbilical hernia repair who presented on 6/17 with abdominal pain. CT revealed a fat containing umbilical hernia. She was discharged with plans to return for elective surgical management but represented on 6/20 with persistent pain. She was taken to the OR on 6/21 for laparoscopic hernia repair. She tolerated the procedure well, her post-operative course was uncomplicated, and she was discharged on 6/22.     Subjective:   Today she endorses persistent left sided abdominal pain that is worse at night. She is currently taking tylenol and ibuprofen to some effect. This pain is new since surgery although she reports her overall abdominal pain has improved. Denies fever or incisional redness, swelling, or drainage. She is tolerating a regular diet without nausea or vomiting and is having regular bowel movements.     Physical Exam:   Vitals:   Vitals:    09/13/20 1134   BP: 114/62   Pulse: 66   Resp: 16   Temp: 36.6 ?C (97.9 ?F)   SpO2: 99%   General: no acute distress  CV: regular rate & rhythm   Resp: clear to auscultation, non-labored, no chest wall tenderness   Abdomen: soft, non-distended, tenderness to palpation over LUQ & LLQ with voluntary guarding on exam, other quadrants non-tender, lap incisions well approximated without erythema or drainage, dermabond intact   Extr: warm, well-perfused  Neuro: A&O x3, non-focal     Assessment & Plan:   [name]'s incisions are healing appropriately. Her postoperative course has been complicated by persistent left sided pain but she is otherwise recovering well. Given she is acutely stable, tolerating a diet, and has no other indications for complication, will continue to monitor at this time. She should continue with tylenol and ibuprofen as needed, apply ice for additional pain management, and wear an abdominal  binder for support if needed. Will plan for follow up in 2-3 weeks with [name] for re-evaluation.

## 2020-09-19 ENCOUNTER — Ambulatory Visit: Payer: PRIVATE HEALTH INSURANCE | Attending: Physician Assistant | Primary: Geriatric Medicine

## 2020-09-29 HISTORY — PX: HERNIA REPAIR: SHX51

## 2020-10-04 ENCOUNTER — Ambulatory Visit: Payer: PRIVATE HEALTH INSURANCE | Attending: Surgery | Primary: Geriatric Medicine

## 2020-10-11 ENCOUNTER — Other Ambulatory Visit

## 2020-10-11 ENCOUNTER — Ambulatory Visit
Admit: 2020-10-11 | Discharge: 2020-10-11 | Payer: PRIVATE HEALTH INSURANCE | Attending: Surgery | Primary: Geriatric Medicine

## 2020-10-11 ENCOUNTER — Encounter (HOSPITAL_BASED_OUTPATIENT_CLINIC_OR_DEPARTMENT_OTHER): Admitting: Surgery

## 2020-10-11 VITALS — BP 122/69 | HR 71 | Temp 97.8°F | Resp 16 | Ht 66.0 in | Wt 190.2 lb

## 2020-10-11 DIAGNOSIS — K432 Incisional hernia without obstruction or gangrene: Secondary | ICD-10-CM

## 2020-10-11 NOTE — Progress Notes (Signed)
 TRAUMA/EMERGENCY GENERAL SURGERY  OUTPATIENT VISIT PROGRESS NOTE  [facility] TRAUMA SURGERY  [address]  [place] [address]  [phone]    Today's Date: 10/11/2020  MRN: [id number]  Name: [name]  DOB: 1970/10/24    Chief Complaint  Postop followup    History Of Present Illness  [name] is a 50 y.o. female presenting as a follow-up appointment s/p laparoscopic hernia repair with mesh on 6/21. Patient presented with strangulating fat-containing recurrent incisional hernia around her umbilicus. She had a prior repair in 2008.     She has overall done well. She has mild left sided pain, where she avoids sleeping on that side. She is able to manage her pain without any pain medications. Bowel movements are regular. No nausea/emesis or fever/chills. She is able to conduct her normal daily activities without concerns.      Past Medical History  She has no past medical history on file.    Surgical History  She has a past surgical history that includes Hernia repair (2008) and Incisional hernia repair (2022).     Social History  She reports that she has never smoked. She has never used smokeless tobacco. She reports that she does not drink alcohol and does not use drugs.    Family History  No family history on file.     Advance Care Plan  Extended Emergency Contact Information  Primary Emergency Contact: [name]  Mobile Phone: [phone]  Relation: Daughter  Preferred language: English  Interpreter needed? No  Prior       Allergies  Patient has no known allergies.     Medications    Current Outpatient Medications:   .  oxyCODONE (Roxicodone) 5 mg immediate release tablet, Take 1 tablet (5 mg) by mouth every 6 (six) hours if needed (breakthrough pain not controlled on other medications) (Patient not taking: Reported on 10/11/2020), Disp: 10 tablet, Rfl: 0    Medications     None          Review of Systems   Constitutional: Negative.    HENT: Negative.    Eyes: Negative.    Respiratory:  Negative.    Cardiovascular: Negative.    Gastrointestinal: Positive for abdominal pain and anal bleeding. Negative for abdominal distention, blood in stool, constipation, diarrhea, nausea, rectal pain and vomiting.   Endocrine: Negative.    Genitourinary: Negative.    Musculoskeletal: Negative.    Skin: Negative.    Allergic/Immunologic: Negative.    Neurological: Negative.    Hematological: Negative.    Psychiatric/Behavioral: Negative.        Objective   Physical Exam  Constitutional:       General: She is not in acute distress.     Appearance: Normal appearance. She is obese. She is not toxic-appearing.   HENT:      Head: Normocephalic and atraumatic.      Mouth/Throat:      Mouth: Mucous membranes are moist.      Pharynx: Oropharynx is clear.   Eyes:      Extraocular Movements: Extraocular movements intact.      Conjunctiva/sclera: Conjunctivae normal.      Pupils: Pupils are equal, round, and reactive to light.   Cardiovascular:      Rate and Rhythm: Normal rate and regular rhythm.      Pulses: Normal pulses.      Heart sounds: Normal heart sounds.   Pulmonary:      Effort: Pulmonary effort is normal.  Breath sounds: Normal breath sounds.   Abdominal:      General: Abdomen is flat.      Palpations: Abdomen is soft.      Comments: Laparoscopic incision sites c/d/i  Mild tenderness to deep palpation in the left upper quadrant without any rebound   Otherwise abd is obese, soft, non-distended   Genitourinary:     Comments: Deferred  Musculoskeletal:         General: Normal range of motion.      Cervical back: Normal range of motion and neck supple.   Skin:     General: Skin is warm.   Neurological:      General: No focal deficit present.      Mental Status: She is alert and oriented to person, place, and time.   Psychiatric:         Mood and Affect: Mood normal.         Thought Content: Thought content normal.         Judgment: Judgment normal.         Last Recorded Vitals  Blood pressure 122/69, pulse 71,  temperature 36.6 C (97.8 F), resp. rate 16, height 1.676 m, weight 86.3 kg, SpO2 100 %.    Relevant Results  N/A     Assessment/Plan   50 yo F with mild obesity BMI 30 s/p lap IPOM of a a prior recurrent umbillical hernia    Overall doing well.  Explained to patient that left sided pain is likely from her mesh and dissolvable tacks used intraperitoneally. Pain doesn't seem to bother her other than not putting direct pressure on it. I would recommend we observe how she does over the next several weeks, I anticipate it will improve.  Pt can see me PRN    Answered all questions to her satisfaction. Daughter was available for direct translation, which is what the patient preferred over hospital interpretation services.    [name] Oravec Janus Molder, MD  Trauma Surgery/Surgical Critical Care Intensivist

## 2020-11-01 ENCOUNTER — Other Ambulatory Visit

## 2020-11-01 ENCOUNTER — Inpatient Hospital Stay: Admit: 2020-11-01 | Payer: PRIVATE HEALTH INSURANCE | Primary: Geriatric Medicine

## 2020-11-01 ENCOUNTER — Encounter (HOSPITAL_BASED_OUTPATIENT_CLINIC_OR_DEPARTMENT_OTHER): Admitting: Adult Health

## 2020-11-01 ENCOUNTER — Other Ambulatory Visit: Admit: 2020-11-01 | Payer: PRIVATE HEALTH INSURANCE | Primary: Geriatric Medicine

## 2020-11-01 ENCOUNTER — Ambulatory Visit
Admit: 2020-11-01 | Discharge: 2020-11-01 | Payer: PRIVATE HEALTH INSURANCE | Attending: Adult Health | Primary: Geriatric Medicine

## 2020-11-01 VITALS — BP 128/84 | HR 66 | Temp 98.3°F | Ht 66.0 in | Wt 188.8 lb

## 2020-11-01 DIAGNOSIS — Z0000 Encounter for general adult medical examination without abnormal findings: Secondary | ICD-10-CM

## 2020-11-01 DIAGNOSIS — M25512 Pain in left shoulder: Secondary | ICD-10-CM

## 2020-11-01 DIAGNOSIS — I83813 Varicose veins of bilateral lower extremities with pain: Secondary | ICD-10-CM

## 2020-11-01 LAB — LIPID PANEL
Cholesterol/HDL Ratio: 2.9
Cholesterol: 184 mg/dL (ref <=200)
HDL cholesterol: 64 mg/dL (ref >=40)
LDL cholesterol, calculated: 108 mg/dL (ref 0–130)
Triglycerides: 58 mg/dL (ref <=150)

## 2020-11-01 LAB — HEMOGLOBIN A1C: HEMOGLOBIN A1C % (INT/EXT): 5.5 % (ref <5.6)

## 2020-11-01 LAB — TSH: TSH: 0.77 u[IU]/mL (ref 0.35–4.94)

## 2020-11-01 NOTE — Progress Notes (Signed)
 [facility]  [address]  [address]  [place] [address]  Dept: [phone]  Dept Fax: [phone]     Patient ID: [name] is a 50 y.o. female who presents for a follow up appt.  Patient of  Dr. Margaretmary Bayley      HPI     Laproscopic Hernia Repair    08/2020 repair with mesh at     New Left shoulder pain    Symptoms x 3 months    Patient works as a PCA/ heavy lifting    Patient uses a "cream" not tylenol or ibuprofen    Symptoms currently    Symptoms worse with range of motion/ adduction of the arm    GYN  G3 P2   22, 24, 26  Pap Smear normal  Periods normal          Patient Active Problem List   Diagnosis   . 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 oxyCODONE (Roxicodone) 5 mg immediate release tablet Take 1 tablet (5 mg) by mouth every 6 (six) hours if needed (breakthrough pain not controlled on other medications)     No Known Allergies  No past medical history on file.  Past Surgical History:   Procedure Laterality Date   . HERNIA REPAIR  2008    Open primary umbilcal hernia repair   . INCISIONAL HERNIA REPAIR  2022    Laparoscopic recurrent umbilical hernia repair with mesh     No family history on file.  Social History     Tobacco Use   . Smoking status: Never Smoker   . Smokeless tobacco: Never Used   Substance Use Topics   . Alcohol use: Never   . Drug use: Never       Objective   Visit Vitals  BP 128/84 (BP Location: Left arm, Patient Position: Sitting, BP Cuff Size: Adult)   Pulse 66   Temp 36.8 C (98.3 F) (Oral)   Ht 1.676 m   Wt 85.6 kg   BMI 30.47 kg/m   OB Status Having periods   BSA 2 m     Review of Systems   Constitutional: Negative.    Eyes: Negative.    Respiratory: Negative.    Cardiovascular: Negative.    Genitourinary: Negative.    Musculoskeletal: Positive for joint pain.        Left shoulder pain    Skin: Negative.      Routine Health Maintenance  Tdap: 2011    Zoster: at age 58   Prevnar/Pneumovax: at age 63   HAV/HBV status:   Pap: Oct 2010: NIL/HPV-, May 2014: NIL/HPV-, June 2019:  ( repeat 2024)  Mammo: 11/2019  Will book today   Colonoscopy: at age 36   DEXA: at age 33  Covid UTD and booster  Cholesterol Screening today  Diabetes Screening today  Eye exam tomorrow  Dental UTD      Subjective   HPI    Patient Active Problem List   Diagnosis   . 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 oxyCODONE (Roxicodone) 5 mg immediate release tablet Take 1 tablet (5 mg) by mouth every 6 (six) hours if needed (breakthrough pain not controlled on  other medications)     No Known Allergies  No past medical history on file.  Past Surgical History:   Procedure Laterality Date   . HERNIA REPAIR  2008    Open primary umbilcal hernia repair   . HERNIA REPAIR  09/29/2020   . INCISIONAL HERNIA REPAIR  2022    Laparoscopic recurrent umbilical hernia repair with mesh       Objective   Visit Vitals  BP 128/84 (BP Location: Left arm, Patient Position: Sitting, BP Cuff Size: Adult)   Pulse 66   Temp 36.8 C (98.3 F) (Oral)   Ht 1.676 m   Wt 85.6 kg   BMI 30.47 kg/m   OB Status Having periods   BSA 2 m       Physical Exam  Constitutional:       Appearance: Normal appearance.   Neck:      Thyroid: No thyroid mass, thyromegaly or thyroid tenderness.   Cardiovascular:      Rate and Rhythm: Normal rate and regular rhythm.      Heart sounds: Normal heart sounds.   Pulmonary:      Effort: Pulmonary effort is normal.      Breath sounds: Normal breath sounds.   Chest:   Breasts:     Right: Normal.   Abdominal:      General: Abdomen is flat. Bowel sounds are normal.      Palpations: Abdomen is soft.   Musculoskeletal:      Cervical back: Neck supple.   Lymphadenopathy:      Cervical: No cervical adenopathy.   Neurological:      Mental Status:  She is alert.   Psychiatric:         Mood and Affect: Mood normal.         Assessment/Plan   [name] was seen today for follow-up.  Need for Tdap vaccination  -     Tdap vaccine greater than or equal to 67 years old IM  Varicose veins of both lower extremities with pain  -     INT  T VASCULAR SURGERY; Future  Annual physical exam  -     Lipid panel  -     TSH; Future  -     Hemoglobin A1c  Acute pain of left shoulder  -     Ambulatory referral to Orthopaedic Surgery; Future  -     XR SHOULDER LEFT 2+ VIEWS; Future

## 2020-11-01 NOTE — Unmapped (Incomplete)
[facility]  [address]  [address]  [place] [address]  Dept: [phone]  Dept Fax: [phone]     Patient ID: [name] is a 50 y.o. female who presents for Annual Exam.    Subjective 50 year old patient of Dr. Margaretmary Bayley comes today for an annual exam     HPI     Laproscopic Hernia Repair    08/2020 repair with mesh at [place]    New Left shoulder pain    Symptoms x 3 months    Patient works as a PCA/ heavy lifting    Patient uses a "cream" not tylenol or ibuprofen    Symptoms currently    Symptoms worse with range of motion/ adduction of the arm    GYN  G3 P2   22, 24, 26  Pap Smear normal  Periods normal          Patient Active Problem List   Diagnosis   ? 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 oxyCODONE (Roxicodone) 5 mg immediate release tablet Take 1 tablet (5 mg) by mouth every 6 (six) hours if needed (breakthrough pain not controlled on other medications)     No Known Allergies  No past medical history on file.  Past Surgical History:   Procedure Laterality Date   ? HERNIA REPAIR  2008    Open primary umbilcal hernia repair   ? INCISIONAL HERNIA REPAIR  2022    Laparoscopic recurrent umbilical hernia repair with mesh     No family history on file.  Social History     Tobacco Use   ? Smoking status: Never Smoker   ? Smokeless tobacco: Never Used   Substance Use Topics   ? Alcohol use: Never   ? Drug use: Never       Objective   Visit Vitals  BP 128/84 (BP Location: Left arm, Patient Position: Sitting, BP Cuff Size: Adult)   Pulse 66   Temp 36.8 ?C (98.3 ?F) (Oral)   Ht 1.676 m   Wt 85.6 kg   BMI 30.47 kg/m?   OB Status Having periods   BSA 2 m?       Physical Exam    Assessment/Plan   There are no diagnoses linked to this encounter.          Routine Health Maintenance  Tdap: 2011    Zoster: at age 72   Prevnar/Pneumovax: at  age 30   HAV/HBV status:   Pap: Oct 2010: NIL/HPV-, May 2014: NIL/HPV-, June 2019:   Mammo: 11/2019  Will book today   Colonoscopy: at age 60   DEXA: at age 50  Covid UTD and booster  Cholesterol Screening today  Diabetes Screening today  Eye exam tomorrow  Dental UTD

## 2020-11-25 ENCOUNTER — Ambulatory Visit: Payer: PRIVATE HEALTH INSURANCE | Attending: Sports Medicine | Primary: Geriatric Medicine

## 2020-12-07 ENCOUNTER — Ambulatory Visit (HOSPITAL_BASED_OUTPATIENT_CLINIC_OR_DEPARTMENT_OTHER): Admitting: Dermatology

## 2020-12-09 ENCOUNTER — Other Ambulatory Visit

## 2020-12-09 ENCOUNTER — Ambulatory Visit
Admit: 2020-12-09 | Discharge: 2020-12-09 | Payer: PRIVATE HEALTH INSURANCE | Attending: Sports Medicine | Primary: Geriatric Medicine

## 2020-12-09 ENCOUNTER — Encounter (HOSPITAL_BASED_OUTPATIENT_CLINIC_OR_DEPARTMENT_OTHER): Admitting: Sports Medicine

## 2020-12-09 DIAGNOSIS — M25512 Pain in left shoulder: Secondary | ICD-10-CM

## 2020-12-09 NOTE — Progress Notes (Signed)
I personally interviewed and examined the patient and both the resident/PA and I contributed to this electronic note.  I agree with the history, exam, assessment and plan as detailed in this note and edited it as necessary.     Dezmon Conover, MD

## 2020-12-09 NOTE — Progress Notes (Signed)
 Department of Orthopaedic Surgery    CHIEF COMPLAINT: left shoulder pain    HPI:   [name] is a 50 y.o., RHD, [place] speaking female here for evaluation and management of left shoulder pain.  Accompanied by her daughter who is translating our visit today. She describes about 3 months of shoulder pain. Denies traumatic injury. The pain is localized to the superior lateral aspect of her shoulder. Denies any further radiating pain, numbness, or tingling.  Worse with overhead reaching and sleeping at night. She has tried tylenol but this has not provided much relief. She also noticed a small lump at the lateral aspect of her shoulder. This is not painful, no associated redness, and she does not think it's grown in size much since she first noticed in 4 weeks ago.     History reviewed. No pertinent past medical history.  Past Surgical History:   Procedure Laterality Date   . HERNIA REPAIR  2008    Open primary umbilcal hernia repair   . HERNIA REPAIR  09/29/2020   . INCISIONAL HERNIA REPAIR  2022    Laparoscopic recurrent umbilical hernia repair with mesh     Family History   Family history unknown: Yes     Social History     Socioeconomic History   . Marital status: Married     Spouse name: Not on file   . Number of children: Not on file   . Years of education: Not on file   . Highest education level: Not on file   Occupational History   . Not on file   Tobacco Use   . Smoking status: Never Smoker   . Smokeless tobacco: Never Used   Vaping Use   . Vaping Use: Never used   Substance and Sexual Activity   . Alcohol use: Never   . Drug use: Never   . Sexual activity: Not on file   Other Topics Concern   . Not on file   Social History Narrative   . Not on file     Social Determinants of Health     Financial Resource Strain: Not on file   Food Insecurity: Not on file   Transportation Needs: No Transportation Needs   . Lack of Transportation (Medical): No   . Lack of Transportation (Non-Medical): No   Physical  Activity: Not on file   Stress: No Stress Concern Present   . Feeling of Stress : Not at all   Social Connections: Not on file   Intimate Partner Violence: Not on file   Housing Stability: Unknown   . Unable to Pay for Housing in the Last Year: No   . Number of Places Lived in the Last Year: Not on file   . Unstable Housing in the Last Year: No     ALLERGIES: Patient has no known allergies.    MEDICATIONS:   Prior to Admission medications    Medication Sig Start Date End Date Taking? Authorizing Provider   oxyCODONE (Roxicodone) 5 mg immediate release tablet Take 1 tablet (5 mg) by mouth every 6 (six) hours if needed (breakthrough pain not controlled on other medications)  Patient not taking: No sig reported 08/31/20   Vertell Novak Barrett, PA        VITAL SIGNS: Ht 1.956 m   BMI 22.39 kg/m      PHYSICAL EXAM:    On examination [name], is a pleasant, well-appearing female, in no apparent distress.  Examination of the  left shoulder reveals skin is clean and intact.  There is no erythema, edema, ecchymosis, or bony deformity.  There is a quarter size cystic lump at the lateral shoulder over the deltoid. Soft, nontender, without erythema. There is tenderness over the bicipital groove and AC joint. Nontender over the lateral cuff, or posterior joint line.  AROM: Forward elevation 160, external rotation 60, IR lumbar spine. Supine PROM FE 180, ER 90, IR 90. Strength 5-/5 supraspinatus and infraspinatus with associated pain. 5/5 subscapularis.  Positive Hawkins. Hands are warm and well-perfused with palpable radial pulses and neurological sensation intact to light touch in A/M/U/R nerve distributions.    IMAGING:  Radiographs of the left shoulder(s) were obtained and reviewed today. No acute fracture or dislocation. No significant degenerative changes. No obvious abnormality appreciated to correlate where the lump is appreciated on exam.     Diagnosis Plan   1. Acute pain of left shoulder  Ambulatory referral to  Orthopaedic Surgery    Ambulatory referral to Physical Therapy       TREATMENT:    Patient was seen and evaluated with Dr. Leandro Reasoner.  All findings were reviewed with the patient today including x-rays.  Clinical presentation is suggestive of left shoulder rotator cuff tendinitis versus subacromial bursitis. She has some weakness compared to the contra lateral side but this may be limited due to pain. Reviewed the natural history and progression of this condition as well as possible treatment options.  Recommended starting with PT and a prescription was provided.  Discussed taking a short course of diligent NSAIDs if she can tolerated safely.  If no improvement after 6 to 8 weeks with therapy recommended follow-up for evaluation. The soft cyst like lump at the shoulder may be a lipoma. She will continue to monitor and we can reevaluate at our next visit. Answered all questions and patient agrees with plan.     [place], [place]

## 2021-01-06 ENCOUNTER — Ambulatory Visit
Admit: 2021-01-06 | Discharge: 2021-01-06 | Payer: PRIVATE HEALTH INSURANCE | Attending: Acupuncturist | Primary: Geriatric Medicine

## 2021-01-06 ENCOUNTER — Other Ambulatory Visit: Admit: 2021-01-06 | Payer: PRIVATE HEALTH INSURANCE | Primary: Geriatric Medicine

## 2021-01-06 ENCOUNTER — Other Ambulatory Visit

## 2021-01-06 VITALS — BP 124/74 | HR 65 | Temp 97.4°F | Ht 65.98 in | Wt 191.0 lb

## 2021-01-06 DIAGNOSIS — R519 Headache, unspecified: Secondary | ICD-10-CM

## 2021-01-06 LAB — CBC WITH DIFFERENTIAL
Basophils %: 0.4 %
Basophils Absolute: 0.03 10*3/uL (ref 0.00–0.22)
Eosinophils %: 2.1 %
Eosinophils Absolute: 0.15 10*3/uL (ref 0.00–0.50)
Hematocrit: 38.4 % (ref 32.0–47.0)
Hemoglobin: 11.8 g/dL (ref 11.0–16.0)
Immature Granulocytes %: 0.3 %
Immature Granulocytes Absolute: 0.02 10*3/uL (ref 0.00–0.10)
Lymphocyte %: 27.8 %
Lymphocytes Absolute: 2.03 10*3/uL (ref 0.70–4.00)
MCH: 25.5 pg — ABNORMAL LOW (ref 26.0–34.0)
MCHC: 30.7 g/dL — ABNORMAL LOW (ref 31.0–37.0)
MCV: 83.1 fL (ref 80.0–100.0)
MPV: 12.2 fL (ref 9.1–12.4)
Monocytes %: 6.2 %
Monocytes Absolute: 0.45 10*3/uL (ref 0.36–0.77)
NRBC %: 0 % (ref 0.0–0.0)
NRBC Absolute: 0 10*3/uL (ref 0.00–2.00)
Neutrophil %: 63.2 %
Neutrophils Absolute: 4.63 10*3/uL (ref 1.50–7.95)
Platelets: 269 10*3/uL (ref 150–400)
RBC: 4.62 M/uL (ref 3.70–5.20)
RDW-CV: 13 % (ref 11.5–14.5)
RDW-SD: 39.2 fL (ref 35.0–51.0)
WBC: 7.3 10*3/uL (ref 4.0–11.0)

## 2021-01-06 LAB — C-REACTIVE PROTEIN: CRP: 6.5 mg/L (ref 0.00–7.48)

## 2021-01-06 LAB — PROTEIN, TOTAL: Protein, total: 8.4 g/dL (ref 6.0–8.4)

## 2021-01-06 LAB — ALBUMIN: Albumin: 4.5 g/dL (ref 3.2–5.0)

## 2021-01-06 LAB — SEDIMENTATION RATE, AUTOMATED: Sed Rate: 50 mm/h — ABNORMAL HIGH (ref 0–20)

## 2021-01-06 MED ORDER — naproxen (Naprosyn) 375 mg tablet
375 | ORAL_TABLET | Freq: Two times a day (BID) | ORAL | 0 refills | 29.00000 days | Status: AC
Start: 2021-01-06 — End: 2021-02-05

## 2021-01-06 NOTE — Progress Notes (Signed)
 [facility] [place]   [address]  [address] [place]  [place] [address]  [phone]     Patient ID:  [name] is a 50 y.o. female  who presents for Headache    Subjective    R temple HA    Headache   This is a new problem. The current episode started 1 to 4 weeks ago. The problem occurs daily. The problem has been gradually worsening. The pain is located in the right unilateral and temporal region. The pain quality is not similar to prior headaches. Quality: ponding. The pain is at a severity of 8/10. Pertinent negatives include no abdominal pain, back pain, coughing, dizziness, ear pain, eye pain, eye watering, fever, hearing loss, insomnia, loss of balance, muscle aches, nausea, neck pain, numbness, phonophobia, rhinorrhea, seizures, sinus pressure, sore throat, tingling, tinnitus, visual change, vomiting or weakness. Nothing aggravates the symptoms. She has tried NSAIDs for the symptoms. The treatment provided no relief. Her past medical history is significant for obesity. There is no history of cancer, hypertension, migraine headaches, migraines in the family, recent head traumas, sinus disease or TMJ.        No past medical history on file.   Past Surgical History:   Procedure Laterality Date   . HERNIA REPAIR  2008    Open primary umbilcal hernia repair   . HERNIA REPAIR  09/29/2020   . INCISIONAL HERNIA REPAIR  2022    Laparoscopic recurrent umbilical hernia repair with mesh        No Known Allergies  Current Outpatient Medications   Medication Instructions   . naproxen (NAPROSYN) 375 mg, oral, 2 times daily with meals   . oxyCODONE (Roxicodone) 5 mg immediate release tablet Take 1 tablet (5 mg) by mouth every 6 (six) hours if needed (breakthrough pain not controlled on other medications)        Review of Systems   Constitutional: Negative for fever.   HENT: Negative for ear pain, hearing loss, rhinorrhea, sinus pressure, sore throat and tinnitus.    Eyes: Negative for pain.    Respiratory: Negative for cough.    Gastrointestinal: Negative for abdominal pain, nausea and vomiting.   Musculoskeletal: Negative for back pain and neck pain.   Neurological: Positive for headaches. Negative for dizziness, tingling, seizures, weakness, numbness and loss of balance.   Psychiatric/Behavioral: The patient does not have insomnia.         Objective    Visit Vitals  BP 124/74   Pulse 65   Temp 36.3 C (97.4 F) (Oral)   Ht 1.676 m   Wt 86.6 kg   BMI 30.84 kg/m   OB Status Having periods   BSA 2.01 m       Physical Exam  Constitutional:       General: She is awake.      Appearance: Normal appearance.   HENT:      Head:        Mouth/Throat:      Mouth: Mucous membranes are moist.   Eyes:      Pupils: Pupils are equal, round, and reactive to light.   Cardiovascular:      Rate and Rhythm: Normal rate and regular rhythm.      Heart sounds: Normal heart sounds.   Pulmonary:      Effort: Pulmonary effort is normal.      Breath sounds: Normal breath sounds.   Musculoskeletal:      Cervical back: Neck  supple.   Skin:     General: Skin is warm.   Neurological:      Mental Status: She is alert and oriented to person, place, and time. Mental status is at baseline.      Cranial Nerves: Cranial nerves 2-12 are intact.      Motor: Motor function is intact.      Coordination: Coordination is intact.      Gait: Gait is intact.      Deep Tendon Reflexes:      Reflex Scores:       Patellar reflexes are 2+ on the right side and 2+ on the left side.  Psychiatric:         Attention and Perception: Attention normal.         Mood and Affect: Affect normal.         Speech: Speech normal.         Behavior: Behavior normal. Behavior is cooperative.          Lab on 01/06/2021   Component Date Value   . Sed Rate 01/06/2021 50 (H)    . CRP 01/06/2021 6.50    . WBC 01/06/2021 7.3    . RBC 01/06/2021 4.62    . Hemoglobin 01/06/2021 11.8    . Hematocrit 01/06/2021 38.4    . MCV 01/06/2021 83.1    . MCH 01/06/2021 25.5 (L)    . MCHC  01/06/2021 30.7 (L)    . RDW-CV 01/06/2021 13.0    . RDW-SD 01/06/2021 39.2    . Platelets 01/06/2021 269    . MPV 01/06/2021 12.2    . Neutrophil % 01/06/2021 63.2    . Lymphocyte % 01/06/2021 27.8    . Monocytes % 01/06/2021 6.2    . Eosinophils % 01/06/2021 2.1    . Basophils % 01/06/2021 0.4    . Immature Granulocytes % 01/06/2021 0.3    . NRBC % 01/06/2021 0.0    . Neutrophils Absolute 01/06/2021 4.63    . Lymphocytes Absolute 01/06/2021 2.03    . Monocytes Absolute 01/06/2021 0.45    . Eosinophils Absolute 01/06/2021 0.15    . Basophils Absolute 01/06/2021 0.03    . Immature Granulocytes Ab* 01/06/2021 0.02    . NRBC Absolute 01/06/2021 0.00    . Albumin (SPEP) 01/06/2021 4.74    . Alpha 1 (SPEP) 01/06/2021 0.18    . Alpha 2 (SPEP) 01/06/2021 0.86    . Beta (SPEP) 01/06/2021 1.14 (H)    . Gamma Globulin (SPEP) 01/06/2021 1.48 (H)    . Protein Electrophoresis * 01/06/2021     . Protein, total 01/06/2021 8.4    . Albumin 01/06/2021 4.5    Lab on 11/01/2020   Component Date Value   . TSH 11/01/2020 0.77    Office Visit on 11/01/2020   Component Date Value   . Triglycerides 11/01/2020 58    . Cholesterol 11/01/2020 184    . HDL cholesterol 11/01/2020 64    . LDL cholesterol, calcula* 28/41/3244 108    . Cholesterol/HDL Ratio 11/01/2020 2.9    . Hemoglobin A1C 11/01/2020 5.5    Admission on 08/29/2020, Discharged on 08/31/2020   Component Date Value   . WBC 08/29/2020 7.6    . RBC 08/29/2020 4.52    . Hemoglobin 08/29/2020 12.0    . Hematocrit 08/29/2020 37.9    . MCV 08/29/2020 83.8    . [facility] 08/29/2020 26.5    . MCHC 08/29/2020 31.7    .  RDW-CV 08/29/2020 12.8    . RDW-SD 08/29/2020 38.7    . Platelets 08/29/2020 247    . MPV 08/29/2020 12.1    . Neutrophil % 08/29/2020 63.1    . Lymphocyte % 08/29/2020 28.7    . Monocytes % 08/29/2020 6.6    . Eosinophils % 08/29/2020 0.8    . Basophils % 08/29/2020 0.4    . Immature Granulocytes % 08/29/2020 0.4    . NRBC % 08/29/2020 0.0    . Neutrophils Absolute 08/29/2020  4.82    . Lymphocytes Absolute 08/29/2020 2.19    . Monocytes Absolute 08/29/2020 0.50    . Eosinophils Absolute 08/29/2020 0.06    . Basophils Absolute 08/29/2020 0.03    . Immature Granulocytes Ab* 08/29/2020 0.03    . NRBC Absolute 08/29/2020 0.00    . Sodium 08/29/2020 140    . Potassium 08/29/2020 3.9    . Chloride 08/29/2020 106    . CO2 (Bicarbonate) 08/29/2020 27    . Anion Gap 08/29/2020 7    . BUN 08/29/2020 13    . Creatinine 08/29/2020 0.74    . eGFRcr 08/29/2020 100    . Glucose 08/29/2020 91    . Fasting? 08/29/2020 Unknown    . Calcium 08/29/2020 9.9    . AST 08/29/2020 21    . ALT 08/29/2020 17    . Alkaline phosphatase 08/29/2020 83    . Protein, total 08/29/2020 8.1    . Albumin 08/29/2020 4.4    . Bilirubin, total 08/29/2020 0.6    . Lipase 08/29/2020 19    . Protime 08/29/2020 13.4    . INR 08/29/2020 1.10    . aPTT 08/29/2020 38.8 (H)    . Color, Ur 08/29/2020 Yellow    . Clarity, Ur 08/29/2020 Clear    . Specific Gravity, Ur 08/29/2020 1.009    . pH, Ur 08/29/2020 6.5    . Protein, Ur 08/29/2020 Negative    . Glucose, Ur 08/29/2020 Negative    . Ketones, Ur 08/29/2020 Negative    . Blood, Ur 08/29/2020 Negative    . Bilirubin, Ur 08/29/2020 Negative    . Urobilinogen, Ur 08/29/2020 0.2    . Nitrite, Ur 08/29/2020 Negative    . Leukocyte Esterase, Ur 08/29/2020 Trace (A)    . WBC, Ur 08/29/2020 13.7 (H)    . RBC, Ur 08/29/2020 <1    . Squamous Epithelial Cell* 08/29/2020 Few    . Bacteria, Ur 08/29/2020 Few    . Hyaline Casts, Ur 08/29/2020 0.0    . hCG, serum, quantitative 08/29/2020 <5    . Lactate, Whole Blood 08/29/2020 0.9    . SARS-CoV-2 RNA PCR 08/29/2020 Negative    . WBC 08/30/2020 6.5    . RBC 08/30/2020 4.20    . Hemoglobin 08/30/2020 11.4    . Hematocrit 08/30/2020 35.7    . MCV 08/30/2020 85.0    . [facility] 08/30/2020 27.1    . MCHC 08/30/2020 31.9    . RDW-SD 08/30/2020 39.5    . RDW-CV 08/30/2020 12.8    . Platelets 08/30/2020 210    . MPV 08/30/2020 12.4    . NRBC % 08/30/2020 0.0     . NRBC Absolute 08/30/2020 0.00    Admission on 08/26/2020, Discharged on 08/26/2020   Component Date Value   . Lipase 08/26/2020 23    . Color, Ur 08/26/2020 Yellow    . Clarity, Ur 08/26/2020 Clear    . Specific Gravity, Ur 08/26/2020 1.003    .  pH, Ur 08/26/2020 7.0    . Protein, Ur 08/26/2020 Negative    . Glucose, Ur 08/26/2020 Negative    . Ketones, Ur 08/26/2020 Negative    . Blood, Ur 08/26/2020 Negative    . Bilirubin, Ur 08/26/2020 Negative    . Urobilinogen, Ur 08/26/2020 0.2    . Nitrite, Ur 08/26/2020 Negative    . Leukocyte Esterase, Ur 08/26/2020 1+ (A)    . WBC, Ur 08/26/2020 10.9 (H)    . RBC, Ur 08/26/2020 <1    . Squamous Epithelial Cell* 08/26/2020 Negative    . Bacteria, Ur 08/26/2020 Few    . Hyaline Casts, Ur 08/26/2020 0.0    . Sodium 08/26/2020 138    . Potassium 08/26/2020 3.6    . Chloride 08/26/2020 104    . CO2 (Bicarbonate) 08/26/2020 27    . Anion Gap 08/26/2020 7    . BUN 08/26/2020 12    . Creatinine 08/26/2020 0.77    . eGFRcr 08/26/2020 95    . Glucose 08/26/2020 93    . Fasting? 08/26/2020 Unknown    . Calcium 08/26/2020 9.8    . AST 08/26/2020 19    . ALT 08/26/2020 14    . Alkaline phosphatase 08/26/2020 91    . Protein, total 08/26/2020 8.2    . Albumin 08/26/2020 4.4    . Bilirubin, total 08/26/2020 0.6    . hCG, serum, quantitative 08/26/2020 <5    . Troponin I 08/26/2020 <0.01    . WBC 08/26/2020 9.0    . RBC 08/26/2020 4.60    . Hemoglobin 08/26/2020 12.3    . Hematocrit 08/26/2020 38.9    . MCV 08/26/2020 84.6    . [facility] 08/26/2020 26.7    . MCHC 08/26/2020 31.6    . RDW-CV 08/26/2020 12.8    . RDW-SD 08/26/2020 39.1    . Platelets 08/26/2020 243    . MPV 08/26/2020 11.6    . Neutrophil % 08/26/2020 64.6    . Lymphocyte % 08/26/2020 27.8    . Monocytes % 08/26/2020 6.3    . Eosinophils % 08/26/2020 0.8    . Basophils % 08/26/2020 0.3    . Immature Granulocytes % 08/26/2020 0.2    . NRBC % 08/26/2020 0.0    . Neutrophils Absolute 08/26/2020 5.79    . Lymphocytes Absolute  08/26/2020 2.50    . Monocytes Absolute 08/26/2020 0.57    . Eosinophils Absolute 08/26/2020 0.07    . Basophils Absolute 08/26/2020 0.03    . Immature Granulocytes Ab* 08/26/2020 0.02    . NRBC Absolute 08/26/2020 0.00        Assessment/Plan    Xanthe was seen today for headache.  Headache around the eyes  -     CBC and differential; Future  -     Sedimentation rate, automated; Future  -     C-reactive protein; Future  -     Protein electrophoresis, serum; Future  -     naproxen (Naprosyn) 375 mg tablet; Take 1 tablet (375 mg) by mouth with breakfast and with evening meal.  -     INT  [facility] Neurology (T NEUROLOGY); Future     PLAN:  Recommendations: asked to keep headache diary.  -Discussed red flags for headache: Sudden onset, or onset with exertion; New, progressive, frequent HA; Trauma; Cancer Hx; Immunosuppresion/HIV; Clotting disorders; Worst HA; fever  - Take acetaminophen 1000 mg or ibuprofen 400 mg or ketoprofen 25 mg. Explained abortive therapy  is more effective when given early after headache onset and usually pain wanes at about 2 hours. Could also use aspirin, naproxen or diclofenac.  - Discussed role of behavioral therapies for headache including: regulation of sleep, exercise, and meals, relaxation, CBT.  - Encouraged patient to keep a log of her symptoms including when they present, associated food, duration, severity and bring to upcoming visit.    Follow up in about 4 weeks (around 02/03/2021) for Recheck HA.     Called pt around 9:38am on 01/11/2021 and talked with pt's daughter about labs and Tx plan.

## 2021-01-10 LAB — PROTEIN ELECTROPHORESIS, SERUM
Albumin (SPEP): 4.74 g/dL (ref 3.50–5.10)
Alpha 1 (SPEP): 0.18 g/dL (ref 0.10–0.30)
Alpha 2 (SPEP): 0.86 g/dL (ref 0.50–1.00)
Beta (SPEP): 1.14 g/dL — ABNORMAL HIGH (ref 0.50–1.10)
Gamma Globulin (SPEP): 1.48 g/dL — ABNORMAL HIGH (ref 0.50–1.30)

## 2021-01-11 ENCOUNTER — Encounter (HOSPITAL_BASED_OUTPATIENT_CLINIC_OR_DEPARTMENT_OTHER): Admitting: Acupuncturist

## 2021-01-11 NOTE — Other (Signed)
 Patient Education   Table of Contents       Form - Headache Record       General Headache Without Cause     To view videos and all your education online visit,   [email or web address].elsevier.com/[email or web address]   or scan this QR code with your smartphone.                    Form - Headache Record     There are many types and causes of headaches. A headache record can help guide your treatment plan. Use this form to record the details. Bring this form with you to your follow-up visits.    Follow your health care provider's instructions on how to describe your headache. You may be asked to:       Use a pain scale. This is a tool to rate the intensity of your headache using words or numbers.       Describe what your headache feels like, such as dull, achy, throbbing, or sharp.     Headache record    Date: _______________ Time (from start to end): ____________________ Location of the headache: _________________________       Intensity of the headache: ____________________ Description of the headache: ______________________________________________________________       Hours of sleep the night before the headache: __________       Food or drinks before the headache started: ______________________________________________________________________________________       Events before the headache started: _______________________________________________________________________________________________       Symptoms before the headache started: __________________________________________________________________________________________       Symptoms during the headache: __________________________________________________________________________________________________       Treatment: ________________________________________________________________________________________________________________       Effect of treatment: _________________________________________________________________________________________________________       Other  comments: ___________________________________________________________________________________________________________      Date: _______________ Time (from start to end): ____________________ Location of the headache: _________________________       Intensity of the headache: ____________________ Description of the headache: ______________________________________________________________       Hours of sleep the night before the headache: __________       Food or drinks before the headache started: ______________________________________________________________________________________       Events before the headache started: ____________________________________________________________________________________________       Symptoms before the headache started: _________________________________________________________________________________________       Symptoms during the headache: _______________________________________________________________________________________________       Treatment: ________________________________________________________________________________________________________________       Effect of treatment: _________________________________________________________________________________________________________       Other comments: ___________________________________________________________________________________________________________      Date: _______________ Time (from start to end): ____________________ Location of the headache: _________________________       Intensity of the headache: ____________________ Description of the headache: ______________________________________________________________       Hours of sleep the night before the headache: __________       Food or drinks before the headache started: ______________________________________________________________________________________       Events before the headache started:  ____________________________________________________________________________________________       Symptoms before the headache started: _________________________________________________________________________________________       Symptoms during the headache: _______________________________________________________________________________________________       Treatment: ________________________________________________________________________________________________________________       Effect of treatment: _________________________________________________________________________________________________________       Other comments: ___________________________________________________________________________________________________________      Date: _______________ Time (from start to end): ____________________ Location of the headache: _________________________       Intensity of the headache: ____________________ Description of the headache: ______________________________________________________________       Hours of  sleep the night before the headache: _________       Food or drinks before the headache started: ______________________________________________________________________________________       Events before the headache started: ____________________________________________________________________________________________       Symptoms before the headache started: _________________________________________________________________________________________       Symptoms during the headache: _______________________________________________________________________________________________       Treatment: ________________________________________________________________________________________________________________       Effect of treatment: _________________________________________________________________________________________________________       Other comments:  ___________________________________________________________________________________________________________      Date: _______________ Time (from start to end): ____________________ Location of the headache: _________________________       Intensity of the headache: ____________________ Description of the headache: ______________________________________________________________       Hours of sleep the night before the headache: _________       Food or drinks before the headache started: ______________________________________________________________________________________       Events before the headache started: ____________________________________________________________________________________________       Symptoms before the headache started: _________________________________________________________________________________________       Symptoms during the headache: _______________________________________________________________________________________________       Treatment: ________________________________________________________________________________________________________________       Effect of treatment: _________________________________________________________________________________________________________       Other comments: ___________________________________________________________________________________________________________     This information is not intended to replace advice given to you by your health care provider. Make sure you discuss any questions you have with your health care provider.     Document Released: 01/06/2020Document Revised: 05/18/2022Document Reviewed: 07/27/2020     Elsevier Patient Education ? 2022 [employer]         General Headache Without Cause     A headache is pain or discomfort you feel around the head or neck area. There are many causes and types of headaches. In some cases, the cause may not be found.   Follow these instructions at home:   Watch your  condition for any changes. Let your doctor know about them. Take these steps to help with your condition:   Managing pain               Take over-the-counter and prescription medicines only as told by your doctor. This includes medicines for pain that are taken by mouth or put on the skin.       Lie down in a dark, quiet room when you have a headache.      If told, put ice on your head and neck area:       Put ice in a plastic bag.       Place a towel between your skin and the bag.       Leave the ice on for 20 minutes, 2?3 times per day.       Take off the ice if your skin turns bright red. This is very important. If you cannot feel pain, heat, or cold, you have a greater risk of damage to the area.      If told, put heat on the affected area. Use the heat source that your doctor recommends, such as a moist heat pack or a heating pad.       Place a towel between your skin and the heat source.       Leave the heat on for 20?30 minutes.       Take off the heat if your skin turns bright red. This is very important. If you cannot feel pain, heat, or cold, you have a greater risk of getting burned.  Keep lights dim if bright lights bother you or make your headaches worse.       Eating and drinking         Eat meals on a regular schedule.      If you drink alcohol:      Limit how much you have to:       0?1 drink a day for women who are not pregnant.       0?2 drinks a day for men.           Know how much alcohol is in a drink. In the [place] one drink equals one 12 oz bottle of beer (355 mL), one 5 oz glass of wine (148 mL), or one 1? oz glass of hard liquor (44 mL).         Stop drinking caffeine, or drink less caffeine.   General instructions           Keep a journal to find out if certain things bring on headaches. For example, write down:       What you eat and drink.       How much sleep you get.       Any change to your diet or medicines.       Get a massage or try other ways to relax.       Limit stress.        Sit up straight. Do not  tighten (tense) your muscles.      Do not  smoke or use any products that contain nicotine or tobacco. If you need help quitting, ask your doctor.       Exercise regularly as told by your doctor.       Get enough sleep. This often means 7?9 hours of sleep each night.       Keep all follow-up visits. This is important.         Contact a doctor if:         Medicine does not help your symptoms.       You have a headache that feels different than the other headaches.       You feel like you may vomit (nauseous) or you vomit.       You have a fever.     Get help right away if:        Your headache:       Gets very bad quickly.       Gets worse after a lot of physical activity.      You have any of these symptoms:       You continue to vomit.       A stiff neck.       Trouble seeing.       Your eye or ear hurts.       Trouble speaking.       Weak muscles or you lose muscle control.       You lose your balance or have trouble walking.       You feel like you will pass out (faint) or you pass out.       You are mixed up (confused).       You have a seizure.     These symptoms may be an emergency. Get help right away. Call your local emergency services (911 in the [place]      Do not wait to see if the symptoms will go away.  Do not drive yourself to the hospital.     Summary         A headache is pain or discomfort that is felt around the head or neck area.       There are many causes and types of headaches. In some cases, the cause may not be found.       Keep a journal to help find out what causes your headaches. Watch your condition for any changes. Let your doctor know about them.       Contact a doctor if you have a headache that is different from usual, or if medicine does not help your headache.       Get help right away if your headache gets very bad, you throw up, you have trouble seeing, you lose your balance, or you have a seizure.     This information is not intended to replace advice  given to you by your health care provider. Make sure you discuss any questions you have with your health care provider.     Document Released: 09/26/2009Document Revised: 05/18/2022Document Reviewed: 07/27/2020     Elsevier Patient Education ? 2022 Elsevier Inc.

## 2021-01-20 ENCOUNTER — Encounter (HOSPITAL_BASED_OUTPATIENT_CLINIC_OR_DEPARTMENT_OTHER): Admitting: Adult Health

## 2021-01-27 ENCOUNTER — Other Ambulatory Visit

## 2021-01-27 ENCOUNTER — Encounter (HOSPITAL_BASED_OUTPATIENT_CLINIC_OR_DEPARTMENT_OTHER): Admitting: Dermatology

## 2021-01-27 ENCOUNTER — Ambulatory Visit: Admit: 2021-01-27 | Payer: PRIVATE HEALTH INSURANCE | Attending: Dermatology | Primary: Geriatric Medicine

## 2021-01-27 DIAGNOSIS — L821 Other seborrheic keratosis: Secondary | ICD-10-CM

## 2021-01-27 DIAGNOSIS — B079 Viral wart, unspecified: Secondary | ICD-10-CM

## 2021-01-27 NOTE — Progress Notes (Signed)
 HPI  [name] is a 50 y.o. female who presents for the following: Skin lesions  Last visit with Dr. Kristine Royal 11/2019 for benign FBSE  [place] speaking, daughter present during visit and translates    1. Skin lesions  - Left cheek bump growing, itchy  - Left upper back bump growing  - Skin tags along neck  - No personal or family history of skin cancer    ROS  Patient feels well. No other skin complaints. No other systemic symptoms.    Physical Exam/Impression/Plan  Well appearing patient in no apparent distress; mood and affect are within normal limits. Skin exam was performed of the following and pertinent positives are included below:   Full body skin exam performed of scalp, face, neck, chest, abdomen, back, bilateral upper extremities, bilateral lower extremities, hands, feet, buttocks performed pertinent for the following below:    Filiform wart  Left Malar Cheek  1-2 mm verrucous skin-colored exophytic papule  - We reviewed this is benign and she does not have to treatment. She would like treatment due to symptomatic nature (itching, growing)  - Skin type IV, used liquid nitrogen with q-tip and needle driver to minimize risk of post-inflammatory skin changes  - We reviewed there is always risk of pigment (hypo or hyperpigmentation) after treatment and she wanted to proceed    Cryotherapy - Left Malar Cheek    Number of cycles:  3  Complexity: simple    Procedure Note: Verbal consent obtained. Risk of pain, scar, recurrence, discoloration, blistering, infection discussed. Wound care instructions discussed.   This procedure was medically necessary because the following was true about the lesion that was treated:  Inflamed, Irritated, Itchy and Infectious.      Benign nevi  Scattered brown macules, papules with regular features under dermoscopy on body throughout  - Reassurance  - Counseled on ABCDEs of melanoma (asymmetry, border, color, diameter, evolution) and ugly duckling sign    Skin tags  Numerous  skin-colored pedunculated papules on neck  - Reassurance on benign nature  - Discussed removal can be done with cosmetic dermatology for out of pocket cost    Seborrheic keratoses  Stuck-on brown, waxy papules and plaques on body throughout  - Left upper back HPI#1  - Reassurance on benign nature    Cherry angioma  -bright red papule on the left breast   - benign, reassured     RTC prn    Darrick Meigs, MD  Resident  01/27/21 9:59 AM

## 2021-01-30 ENCOUNTER — Ambulatory Visit
Admit: 2021-01-30 | Discharge: 2021-01-30 | Payer: PRIVATE HEALTH INSURANCE | Attending: Surgery | Primary: Geriatric Medicine

## 2021-01-30 ENCOUNTER — Other Ambulatory Visit

## 2021-01-30 VITALS — BP 116/72 | HR 66 | Temp 97.8°F | Resp 16 | Ht 66.0 in | Wt 190.0 lb

## 2021-01-30 DIAGNOSIS — I8311 Varicose veins of right lower extremity with inflammation: Secondary | ICD-10-CM

## 2021-01-30 NOTE — Progress Notes (Signed)
 VASCULAR SURGERY H&P NOTE    Chief Complaint: Spider veins      HPI: [name] is a 50 year old female with no significant PMH who presents in office this morning for concern of painful spider veins of the bilateral lower extremities. She states these have been present for many years but recently they have become more painful. Right leg > left. She does not currently wear compression stockings or elevate legs. She denies leg swelling, open wounds, hx DVT, or leg pain with walking or standing for long periods of time.     Problem List:   Patient Active Problem List   Diagnosis   . 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    Past Surgical History:   Past Surgical History:   Procedure Laterality Date   . HERNIA REPAIR  2008    Open primary umbilcal hernia repair   . HERNIA REPAIR  09/29/2020   . INCISIONAL HERNIA REPAIR  2022    Laparoscopic recurrent umbilical hernia repair with mesh       Allergies:   No Known Allergies    Outpatient Medications:     Current Outpatient Medications:   .  naproxen (Naprosyn) 375 mg tablet, Take 1 tablet (375 mg) by mouth with breakfast and with evening meal., Disp: 60 tablet, Rfl: 0  .  oxyCODONE (Roxicodone) 5 mg immediate release tablet, Take 1 tablet (5 mg) by mouth every 6 (six) hours if needed (breakthrough pain not controlled on other medications) (Patient not taking: Reported on 10/11/2020), Disp: 10 tablet, Rfl: 0    Tobacco Use:   Social History     Tobacco Use   Smoking Status Never   Smokeless Tobacco Never       Alcohol:   Social History     Substance and Sexual Activity   Alcohol Use Never           Family History:   Family History   Family history unknown: Yes     Visit Vitals  BP 116/72 (BP Location: Left arm, Patient Position: Sitting, BP Cuff Size: Adult long)   Pulse 66   Temp 36.6 C (97.8 F) (Temporal)   Resp 16      ROS: Negative except spider  veins  EXAM:  GENERAL - A&O, NAD  HEENT - MMM  NECK - no bruits  HEART - RRR, no m,r,g  CHEST - CTA B/L  ABDOMEN - BS+, soft, NT, ND   BACK - No CVAT  EXT- Cluster of telangiectasias of the right upper thigh and left upper thigh, mild LE edema, NT, PTs palp B/LNormal2+ and symmetric  NEUROLOGIC - grossly intact    Labs:   Lab Results   Component Value Date    WBC 7.3 01/06/2021    HCT 38.4 01/06/2021       Lab Results   Component Value Date    NA 140 08/29/2020    K 3.9 08/29/2020    CL 106 08/29/2020    CO2 27 08/29/2020    BUN 13 08/29/2020    GLUCOSE 91 08/29/2020       Lab Results   Component Value Date    TSH 0.77 11/01/2020        Lab Results   Component Value Date    PT 13.4 08/29/2020    APTT 38.8 (H) 08/29/2020    INR  1.10 08/29/2020       Imaging: no recent imaging     Assessment/Plan: [name] is a 50 year old female here today to discuss management of spider veins in bilateral lower extremities causing pain. We will plan for sclerotherapy, beginning with the right leg. Additionally, we will obtain a VCT prior to the procedure to determine if there is any reflux of GSV or SSV which may be contributing to pain. She is advised to wear compression stockings 30-70mmHg and elevate legs above the level of the heart at rest and maintain low sodium diet. Our scheduler will reach out with time and date of this procedure.     Thank you for allowing me to be a part of this patient's care.     Levora Angel, PA-C  Vascular Surgery

## 2021-02-07 ENCOUNTER — Ambulatory Visit: Payer: PRIVATE HEALTH INSURANCE | Attending: Adult Health | Primary: Geriatric Medicine

## 2021-02-08 ENCOUNTER — Ambulatory Visit
Admit: 2021-02-08 | Discharge: 2021-02-08 | Payer: PRIVATE HEALTH INSURANCE | Attending: Adult Health | Primary: Geriatric Medicine

## 2021-02-08 ENCOUNTER — Other Ambulatory Visit

## 2021-02-08 ENCOUNTER — Inpatient Hospital Stay: Admit: 2021-02-08 | Payer: PRIVATE HEALTH INSURANCE | Primary: Geriatric Medicine

## 2021-02-08 VITALS — BP 137/80 | HR 78 | Temp 98.3°F | Resp 18 | Ht 66.0 in | Wt 193.2 lb

## 2021-02-08 DIAGNOSIS — M25561 Pain in right knee: Secondary | ICD-10-CM

## 2021-02-08 MED ORDER — cyclobenzaprine (Flexeril) 5 mg tablet
5 | ORAL_TABLET | Freq: Three times a day (TID) | ORAL | 0 refills | Status: AC
Start: 2021-02-08 — End: 2021-02-18

## 2021-02-08 NOTE — Progress Notes (Signed)
 [facility]  [address]  [address]  [place] [address]  Dept: [phone]  Dept Fax: [phone]     Patient ID: [name] is a 50 y.o. female who presents for a follow up visit    Subjective 50 year old patient of Dr. Margaretmary Bayley    HPI Follow up Headache    Headache  Seen on on 01/06/21 for an UC appt for a 1-4 week history of a right temple headache  Noted pain on a level on 8/10   Symptoms continue daily  Right temporal/ symptoms last for 2 hours during the day and then all night  She denies N/V  Taking Naprosyn 375mg /daily  Patient has not been keeping a headache diary /notes symptoms mostly at rest  No vision changes/ denies photophobia or n/v UTD with eye exam    Right Knee Pain  History of these symptoms in past/ seen by Ortho   States she has had an injection in the past  Pain x 2 weeks  Denies trauma or injury  Will book a follow up appt in Ortho            Patient Active Problem List   Diagnosis   . 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 oxyCODONE (Roxicodone) 5 mg immediate release tablet Take 1 tablet (5 mg) by mouth every 6 (six) hours if needed (breakthrough pain not controlled on other medications)     No Known Allergies  No past medical history on file.  Past Surgical History:   Procedure Laterality Date   . HERNIA REPAIR  2008    Open primary umbilcal hernia repair   . HERNIA REPAIR  09/29/2020   . INCISIONAL HERNIA REPAIR  2022    Laparoscopic recurrent umbilical hernia repair with mesh     Family History   Family history unknown: Yes     Social History     Tobacco Use   . Smoking status: Never   . Smokeless tobacco: Never   Vaping Use   . Vaping Use: Never used   Substance Use Topics   . Alcohol use: Never   . Drug use: Never       Objective   Visit Vitals  BP 137/80 (BP Location: Left arm, Patient Position:  Sitting, BP Cuff Size: Large adult)   Pulse 78   Temp 36.8 C (98.3 F) (Oral)   Resp 18   Ht 1.676 m   Wt 87.6 kg   SpO2 96%   BMI 31.18 kg/m   OB Status Having periods   BSA 2.02 m       Physical Exam  HENT:      Nose: Nose normal.      Mouth/Throat:      Pharynx: Oropharynx is clear.      Comments: Tenderness noted over right TMJ with a clicking    Eyes:      Extraocular Movements: Extraocular movements intact.      Right eye: No nystagmus.      Left eye: No nystagmus.      Pupils: Pupils are equal, round, and reactive to light.   Cardiovascular:      Rate and Rhythm: Regular rhythm.      Heart sounds: Normal heart sounds.   Pulmonary:      Breath sounds: Normal breath  sounds.   Musculoskeletal:         General: Normal range of motion.   Neurological:      General: No focal deficit present.      Mental Status: She is alert and oriented to person, place, and time.      Cranial Nerves: Cranial nerves 2-12 are intact.      Sensory: Sensation is intact.      Motor: Motor function is intact.      Coordination: Coordination is intact.      Gait: Gait is intact.      Comments: 5/5 mm str upper extremities         Assessment/Plan   [name] was seen today for follow-up.  Acute pain of right knee  -     XR KNEE RIGHT 3 VIEWS; Future  -     INT  [facility]); Future  TMJ (sprain of temporomandibular joint), initial encounter  Comments:  ? chronic symptoms  referral to TMJ clinic  Orders:  -     Ambulatory referral to TMJ Pain Clinic; Future  -     cyclobenzaprine (Flexeril) 5 mg tablet; Take 1 tablet (5 mg) by mouth in the morning, at noon, and at bedtime for 10 days.  Intractable headache, unspecified chronicity pattern, unspecified headache type  -     cyclobenzaprine (Flexeril) 5 mg tablet; Take 1 tablet (5 mg) by mouth in the morning, at noon, and at bedtime for 10 days.      Neurology appt 03/28/2021  Dr. Margaretmary Bayley appt 04/20/2021  Patient knows to call/ rtc with any worsening or change  in symptoms

## 2021-02-21 ENCOUNTER — Ambulatory Visit: Payer: PRIVATE HEALTH INSURANCE | Attending: Sports Medicine | Primary: Geriatric Medicine

## 2021-02-24 ENCOUNTER — Telehealth (HOSPITAL_BASED_OUTPATIENT_CLINIC_OR_DEPARTMENT_OTHER): Admitting: Sports Medicine

## 2021-02-24 NOTE — Telephone Encounter (Signed)
 Left voicemail for the patient as Dr. Leandro Reasoner is not in clinic on 12/20 at 2:15PM. Asked the patient to call our office back to reschedule.

## 2021-02-27 NOTE — Telephone Encounter (Signed)
 Spoke with the patient's daughter and she will call back to reschedule.

## 2021-02-28 ENCOUNTER — Ambulatory Visit: Payer: PRIVATE HEALTH INSURANCE | Primary: Geriatric Medicine

## 2021-02-28 ENCOUNTER — Other Ambulatory Visit (HOSPITAL_BASED_OUTPATIENT_CLINIC_OR_DEPARTMENT_OTHER): Admitting: Adult Health

## 2021-02-28 ENCOUNTER — Encounter

## 2021-02-28 ENCOUNTER — Ambulatory Visit: Payer: PRIVATE HEALTH INSURANCE | Attending: Sports Medicine | Primary: Geriatric Medicine

## 2021-03-01 ENCOUNTER — Encounter (HOSPITAL_BASED_OUTPATIENT_CLINIC_OR_DEPARTMENT_OTHER): Admitting: Adult Health

## 2021-03-02 ENCOUNTER — Other Ambulatory Visit (HOSPITAL_BASED_OUTPATIENT_CLINIC_OR_DEPARTMENT_OTHER): Admitting: Adult Health

## 2021-03-20 ENCOUNTER — Encounter

## 2021-03-20 ENCOUNTER — Ambulatory Visit: Payer: PRIVATE HEALTH INSURANCE | Primary: Geriatric Medicine

## 2021-03-28 ENCOUNTER — Ambulatory Visit: Payer: PRIVATE HEALTH INSURANCE | Attending: Neurology | Primary: Geriatric Medicine

## 2021-04-03 ENCOUNTER — Ambulatory Visit: Payer: PRIVATE HEALTH INSURANCE | Attending: Surgery | Primary: Geriatric Medicine

## 2021-04-03 ENCOUNTER — Encounter

## 2021-04-20 ENCOUNTER — Ambulatory Visit: Payer: PRIVATE HEALTH INSURANCE | Attending: Geriatric Medicine | Primary: Geriatric Medicine

## 2021-04-20 ENCOUNTER — Encounter (HOSPITAL_BASED_OUTPATIENT_CLINIC_OR_DEPARTMENT_OTHER): Admitting: Neurology

## 2021-11-03 ENCOUNTER — Ambulatory Visit: Payer: PRIVATE HEALTH INSURANCE | Attending: Geriatric Medicine | Primary: Geriatric Medicine

## 2021-11-28 ENCOUNTER — Other Ambulatory Visit: Admit: 2021-11-28 | Payer: PRIVATE HEALTH INSURANCE | Primary: Geriatric Medicine

## 2021-11-28 ENCOUNTER — Inpatient Hospital Stay: Admit: 2021-11-28 | Payer: PRIVATE HEALTH INSURANCE | Primary: Geriatric Medicine

## 2021-11-28 ENCOUNTER — Ambulatory Visit: Admit: 2021-11-28 | Payer: PRIVATE HEALTH INSURANCE | Attending: Adult Health | Primary: Geriatric Medicine

## 2021-11-28 DIAGNOSIS — M79672 Pain in left foot: Secondary | ICD-10-CM

## 2021-11-28 DIAGNOSIS — M25561 Pain in right knee: Secondary | ICD-10-CM

## 2021-11-28 DIAGNOSIS — M1711 Unilateral primary osteoarthritis, right knee: Secondary | ICD-10-CM

## 2021-11-28 LAB — BASIC METABOLIC PANEL
Anion Gap: 10 mmol/L (ref 3–14)
BUN: 16 mg/dL (ref 6–24)
CO2 (Bicarbonate): 25 mmol/L (ref 20–32)
Calcium: 9.6 mg/dL (ref 8.5–10.5)
Chloride: 105 mmol/L (ref 98–110)
Creatinine: 0.67 mg/dL (ref 0.55–1.30)
Glucose: 99 mg/dL (ref 70–139)
Potassium: 3.8 mmol/L (ref 3.6–5.2)
Sodium: 140 mmol/L (ref 135–146)
eGFRcr: 107 mL/min/{1.73_m2} (ref >=60)

## 2021-11-28 LAB — HEMOGLOBIN A1C: HEMOGLOBIN A1C % (INT/EXT): 5.7 % — ABNORMAL HIGH (ref <5.6)

## 2021-11-28 MED ORDER — naproxen (Naprosyn) 375 mg tablet
375 | ORAL_TABLET | Freq: Two times a day (BID) | ORAL | 0 refills | 29.00000 days | Status: DC | PRN
Start: 2021-11-28 — End: 2021-12-27

## 2021-11-28 NOTE — Progress Notes (Signed)
[facility]  [address]  [address]  [place] [address]  Dept: [phone]  Dept Fax: [phone]     Patient ID: [name] is a 51 y.o. female who presents for Shoulder Pain, Foot Swelling, and Knee Pain.    Subjective   HPI    Right Knee Pain/ symptoms continue  Symptoms have become worse  She is taking Naproxen 500mg  with some relief/ OTC naprosyn not helping  She missed her appt with Ortho due to covid     Will book a follow up appt    Xray / Knee  02/2021  FINDINGS: Slight cartilage loss is seen in medial compartment of the right tibiofemoral joint. Findings could be secondary to mild osteoarthritis. There are more advanced osteoarthritic changes affecting the right patellofemoral joint particularly on the  medial side of the joint with significant hypertrophic changes seen in medial facet of the patella and medial wall of the trochlea. A moderate suprapatellar knee joint effusion is identified. No loose bodies are seen.    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 naproxen (NAPROSYN) 375 mg, oral, 2 times daily PRN     No Known Allergies  No past medical history on file.  Past Surgical History:   Procedure Laterality Date   . HERNIA REPAIR  2008    Open primary umbilcal hernia repair   . HERNIA REPAIR  09/29/2020   . INCISIONAL HERNIA REPAIR  2022    Laparoscopic recurrent umbilical hernia repair with mesh     Family History    Family history unknown: Yes     Social History     Tobacco Use   . Smoking status: Never   . Smokeless tobacco: Never   Vaping Use   . Vaping Use: Never used   Substance Use Topics   . Alcohol use: Never   . Drug use: Never       Objective   Visit Vitals  BP 136/81 (BP Location: Left arm, Patient Position: Sitting, BP Cuff Size: Adult long)   Pulse 79   Temp 36.7 C (98.1 F) (Oral)   Resp 18   Ht 1.676 m   Wt 88.4 kg   SpO2 99%   BMI 31.44 kg/m   OB Status Having periods   BSA 2.03 m   Review of Systems   Respiratory: Negative.    Cardiovascular: Negative.    Musculoskeletal: Positive for joint pain. Negative for falls.         Physical Exam  Constitutional:       Appearance: Normal appearance.   Cardiovascular:      Rate and Rhythm: Normal rate and regular rhythm.      Heart sounds: Normal heart sounds.   Pulmonary:      Breath sounds: Normal breath  sounds.   Musculoskeletal:         General: No swelling or tenderness. Normal range of motion.      Comments: Right knee  No redness/ slight swelling/ nl gait    Left foot   No pain/ swelling  Tender to palp over 5 metatarsal        Neurological:      General: No focal deficit present.      Mental Status: She is alert.   Psychiatric:         Mood and Affect: Mood normal.         Assessment/Plan   Katana was seen today for shoulder pain, foot swelling and knee pain.  Acute pain of right knee  Comments:  will book f/u with Ortho  she missed her appt due to covid  xray OA/ effusion  Orders:  -     INT  [facility] (T ORTHOPAEDIC); Future  -     Basic metabolic panel; Future  -     naproxen (Naprosyn) 375 mg tablet; Take 1 tablet (375 mg) by mouth if needed in the morning and at bedtime for pain score 1-3 (pain/ with meals).  Left foot pain  Comments:  etiology unclear  nl exam  will check xray/ ortho follow up   Screening for colon cancer  -     Colonoscopy; Future  Screening for diabetes mellitus  -     Hemoglobin A1c  Rash without  hives  Comments:  symptoms intermitt x 2 months  arms and legs only  not itchy  ? dermagraphia/ mild on exam  denies new skin products/ foods/detergent/ pets  will book a derm appt     Orders:  -     INT  [facility] (T GEN DERMATOLOGY); Future      Patient Health Questionnaire-2 Score: 0 Interpretation: Negative screening.      Follow-up & Interventions: Maintain annual screening - No additional Follow-up required       Routine Health Maintenance  Mammogram 9/21  Will book today  Colonoscopy will book  Pap Smear 08/28/2017/ repeat 2024  Lipid profile 11/01/20  Cholesterol 184/ LDL 108  Will book an annual with Dr. Margaretmary Bayley

## 2021-12-13 ENCOUNTER — Ambulatory Visit: Payer: PRIVATE HEALTH INSURANCE | Attending: Orthopaedic Surgery | Primary: Geriatric Medicine

## 2021-12-27 ENCOUNTER — Inpatient Hospital Stay: Admit: 2021-12-27 | Payer: PRIVATE HEALTH INSURANCE | Primary: Geriatric Medicine

## 2021-12-27 ENCOUNTER — Ambulatory Visit
Admit: 2021-12-27 | Discharge: 2021-12-27 | Payer: PRIVATE HEALTH INSURANCE | Attending: Orthopaedic Surgery | Primary: Geriatric Medicine

## 2021-12-27 DIAGNOSIS — M25569 Pain in unspecified knee: Secondary | ICD-10-CM

## 2021-12-27 DIAGNOSIS — M170 Bilateral primary osteoarthritis of knee: Secondary | ICD-10-CM

## 2021-12-27 DIAGNOSIS — M1711 Unilateral primary osteoarthritis, right knee: Secondary | ICD-10-CM

## 2021-12-27 MED ORDER — lidocaine (Xylocaine) 10 mg/mL (1 %) injection 3 mL
10 | Freq: Once | INTRAMUSCULAR | Status: AC | PRN
Start: 2021-12-27 — End: 2021-12-27
  Administered 2021-12-27: 20:00:00 3 mL via INTRAMUSCULAR

## 2021-12-27 MED ORDER — betamethasone acet,sod phos (Celestone) injection 12 mg
6 | Freq: Once | INTRAMUSCULAR | Status: AC | PRN
Start: 2021-12-27 — End: 2021-12-27
  Administered 2021-12-27: 20:00:00 12 mg via INTRA_ARTICULAR

## 2021-12-27 NOTE — Progress Notes (Signed)
CONSULT ORTHOPAEDIC: KNEE    PRIMARY CARE PHYSICIAN: Para Marchinthya Marturano, MD   REFERRING PROVIDER: Theodis AguasGreene-Hellman, Susan L*, [phone]     1. Primary osteoarthritis of right knee      ASSESSMENT & PLAN:  Impression:  51 y.o. female with right knee osteoarthritis    I reviewed the patient's history, physical exam, and radiographic findings with them. I explained the diagnosis, pathophysiology and natural history of the osteoarthritis. I discussed the operative and non-operative management options. Various treatment options were offered. I reviewed other interventions for osteoarthritis including maintaining a healthy weight, over the counter medications, and physical therapy and/or home exercise. We did review that anti-inflammatory medicines would be beneficial for flares or times of increased activity if the patient can tolerate them. PT would be to encourage knee specific exercises, these include quad strengthening set focused on terminal extension working in closed chain exercises later in the regimen. We also discussed the advantages and disadvantages of cortisone injections and how while providing symptomatic relief they may induce more chondral cell damage through their catabolic effects.     The patient understands and has elected to have a right knee corticosteroid injection and tolerated the procedure well. Usual post injection instructions were provided.    We have also recommend initiation of physical therapy, stabilizer strengthening, with teaching of home exercise program and modalities.   Patient opted for home exercise programs today.     We advised patient on appropriate usage of NSAIDs if not contraindicated medically.  GI and renal precautions were discussed.  Possible future intervention with repeat injection was discussed. The patient will follow-up as needed with the understanding that the next possible date for a corticosteroid injection is in 3 months.     SUBJECTIVE  CHIEF COMPLAINT: Right  knee pain  HPI:   [name] is a 51 y.o. female [place] creole speaking here for evaluation and management of right knee pain. She has had progressive problems with the knee(s) over the past 1 year. Denies inciting trauma or event. The pain does wake the patient at night.   She had similar symptoms in her left knee about 6years ago which resolved with CSI at the time. She will like to consider CSI in the right knee.    Currently the pain in the joint is rated as moderate. The pain is worse with stair climbing, deep knee bending, getting up from a chair, weight bearing, sitting for prolonged periods of time activity.  The pain is localized to the medial and lateral knee and does radiate to posterior knee. The pain is described as aching and sharp. Prior interventions have included activity modification, OTC NSAIDs  and home exercise program. These have provided limited symptomatic relief. She has never had injection in the right knee prior. This pain does affect the patient's activities of daily living. She denies any fevers, chills, redness, warmth, distal numbness or tingling.     Preoperative Ambulatory Status: community ambulator without devices    Surgical Risk Factors:  Most recent Hgb A1c 5.7  Most recent Hgb 11.8  Most recent Albumin 4.4  Body mass index is 30.99 kg/m.  History reviewed. No pertinent past medical history.  Past Surgical History:   Procedure Laterality Date   . HERNIA REPAIR  2008    Open primary umbilcal hernia repair   . HERNIA REPAIR  09/29/2020   . INCISIONAL HERNIA REPAIR  2022    Laparoscopic recurrent umbilical hernia repair with mesh  Family History   Family history unknown: Yes     Social History     Socioeconomic History   . Marital status: Married     Spouse name: Not on file   . Number of children: Not on file   . Years of education: Not on file   . Highest education level: Not on file   Occupational History   . Not on file   Tobacco Use   . Smoking status: Never   .  Smokeless tobacco: Never   Vaping Use   . Vaping Use: Never used   Substance and Sexual Activity   . Alcohol use: Never   . Drug use: Never   . Sexual activity: Yes   Other Topics Concern   . Not on file   Social History Narrative   . Not on file     Social Determinants of Health     Financial Resource Strain: Not on file   Food Insecurity: Unknown (11/28/2021)    Hunger Vital Sign    . Worried About Programme researcher, broadcasting/film/video in the Last Year: Never true    . Ran Out of Food in the Last Year: Not on file   Transportation Needs: Unknown (11/28/2021)    PRAPARE - Transportation    . Lack of Transportation (Medical): Not on file    . Lack of Transportation (Non-Medical): No   Physical Activity: Not on file   Stress: No Stress Concern Present (11/01/2020)    [employer] of Occupational Health - Occupational Stress Questionnaire    . Feeling of Stress : Not at all   Social Connections: Not on file   Intimate Partner Violence: Not on file   Housing Stability: Unknown (11/28/2021)    Housing Stability Vital Sign    . Unable to Pay for Housing in the Last Year: Not on file    . Number of Places Lived in the Last Year: Not on file    . Unstable Housing in the Last Year: No     ALLERGIES: Patient has no known allergies.    MEDICATIONS:   Prior to Admission medications    Medication Sig Start Date End Date Taking? Authorizing Provider   naproxen (Naprosyn) 375 mg tablet TAKE 1 TABLET (375 MG) BY MOUTH IF NEEDED IN THE MORNING AND AT BEDTIME FOR PAIN SCORE 1-3 (PAIN/ WITH MEALS). 12/27/21 03/27/22  Para March, MD   naproxen (Naprosyn) 375 mg tablet Take 1 tablet (375 mg) by mouth if needed in the morning and at bedtime for pain score 1-3 (pain/ with meals). 11/28/21 12/27/21  Darl Pikes L. Mare Loan, NP        ROS: Reviewed and pertinent MSK findings noted above.    PHYSICAL EXAM  Ht 1.676 m   Wt 87.1 kg   BMI 30.99 kg/m      The patient is appropriately conversant.  Normal mood and affect.  No respiratory distress.        Pedal pulses are palpable bilaterally.     Fires TA, GSC, EHL, FHL bilaterally.  Distal sensation is intact throughout.      Skin is intact without lesions about both lower extremities.    Gait evaluation reveals a no limp.      Previous scars: none.      Right knee: Mild effusion.  Range of motion 0-115 degrees. Knee motion does reproduce pain.  No extensor lag.  No true varus, valgus, anterior, or posterior instability within that range of motion.  Tenderness to palpation: MJL, LJL. No patellar crepitus.    Left knee: Mild effusion. Range of motion 0-120 degrees. Knee motion does reproduce pain.  No extensor lag.  No true varus, valgus, anterior, or posterior instability within that range of motion. Tenderness to palpation: Mild to MJL.  No patellar crepitus.       DATA:  Radiographs of the right knee(s) were reviewed and interpreted today by me. These reveal moderate joint space narrowing with osteophyte formation. Disease is most pronounced in the medial and patellofemoral compartment(s).    L Inj/Asp: R knee on 12/27/2021 3:40 PM  Indications: pain  Details: 22 G needle, superolateral approach  Medications: 12 mg betamethasone acet,sod phos 6 mg/mL; 3 mL lidocaine 10 mg/mL (1 %)  Outcome: tolerated well, no immediate complications    Corticosteroid injection:    RIGHT knee injection: The patient was informed about risks and benefits of the procedure. Allergies and laterality were verified. The correct knee was prepped with betadine and isopropyl alcohol. The knee was injected with 3mL of 1% lidocaine plain mixed with 2mL of Celestone through a superior-lateral portal. A sterile bandage was applied. The patient tolerated the procedure well.    Patient tolerance: tolerated well, no immediate complications          SIGNATURE: Barth Kirks, MD  PATIENT NAME: [name]  DATE: December 27, 2021 MRN: [id number]  TIME: 3:06 PM

## 2022-01-02 ENCOUNTER — Ambulatory Visit: Admit: 2022-01-02 | Payer: PRIVATE HEALTH INSURANCE | Attending: Dermatology | Primary: Geriatric Medicine

## 2022-01-02 DIAGNOSIS — L309 Dermatitis, unspecified: Secondary | ICD-10-CM

## 2022-01-02 MED ORDER — cetirizine (ZyrTEC) 10 mg tablet
10 | ORAL_TABLET | ORAL | 3 refills | 90.00000 days | Status: AC
Start: 2022-01-02 — End: ?

## 2022-01-02 NOTE — Progress Notes (Signed)
HPI  Last seen by: [name] HewIsha Tiernan MD on 01/27/2021  [name] DesanctisMaria [name] is a 51 y.o. female who presents for the following:     [place]de[place] speaking, daughter present during visit and translates    1. Rash  -comes and goes the past few months  -not itchy, painful or burning   -on arms, legs (forearms and thighs)   -does not wear tight pants   -when episodes happen takes benadryl and uses hydrocortisone with some improvement but doesnt go away   -denies a prior h/o eczema  -no tongue swelling or throat tightness   -episodes maybe last a week   -last episode: last month- last time she took benadryl  -using dove sensitive skin soap and cetaphil lotion   -denies new products or oral medications     Derm hx:   -filiform wart on left malar cheek treated w/ LN 2 01/2021    ROS  Patient feels well. No other skin complaints. No other systemic symptoms.    Physical Exam/Impression/Plan  Well appearing patient in no apparent distress; mood and affect are within normal limits. Skin exam was performed of the following and pertinent positives are included below:   Face, neck, chest, back, abdomen, upper extremities, hands, lower extremities, feet, digits and nails, groin, buttocks, scalp.     Dermatitis, unspecified  Dermographism   -no rash present today   -pt reports a rash that comes and goes; maybe hive like   -pt is mildly dermographic on exam today so favor hives; mild flare and wheel with scratch test   - Recommended OTC moisturizer and discussed gentle skin care.   PLAN:   -if rash reoccurs take zrytec around the clock as benadryl only lasts 6-8 hours  -RTC if rash reoccurs to see rash when active     Related Medications  cetirizine (ZyrTEC) 10 mg tablet  Take 1 pill by mouth daily for active rash. Can increase to two pills daily if helps.    Lentigines  Light brown macules and patches in photo-distributed areas.    -Benign, reassured.  - Sun protection was also discussed in full. The proper use of broad-spectrum UVB/UVA  sunscreens with SPF 30 or greater was reviewed and the need for re-application after swimming or sweating or 2-3 hours was emphasized. We talked about judicious use of clothing and avoidance of peak periods of sun exposure. I made the patient aware of the need for year-round protection and discussed the fact that UVA can go through window glass.       Seborrheic keratosis  Stuck-on verrucous papule(s).   -Benign, reassured.     RTC PRN    [name] FisherErika L Reannah Totten, MD  Resident

## 2022-02-16 NOTE — Telephone Encounter (Signed)
Patient's daughter Valentina Gu called to report that her mother received a right knee injection from Dr. Wynelle Link on 10/18 but is still having severe pain in her right knee. Daughter reports that her mother is also starting to have pain in left knee. Daughter wants to know what the next step would be and if Dr. Wynelle Link needs to see patient for another appointment.     Best call back: [phone]

## 2022-02-16 NOTE — Telephone Encounter (Signed)
Called the PT to setup an appointment to see dr Wynelle Link left a voicemail for her to call back and schedule.

## 2022-02-16 NOTE — Telephone Encounter (Signed)
Called and spoke to patient's daughter. Patient was last seen about 7 weeks ago and received CSI in the right knee with good pain relief. She states pain has gradually returned and she would like to discuss further options. No fevers, no chills, no calf swelling, no calf pain, no redness, no trauma.   Discussed possibility of long acting Zilretta injections vs HA injections.   She is also having worsening left knee pain. Encouraged patient to return to clinic to try injection in the left knee. At that time we can discuss other treatment options for the right knee.   ED precautions discussed as well. She voiced understanding.

## 2022-03-14 ENCOUNTER — Ambulatory Visit: Payer: PRIVATE HEALTH INSURANCE | Attending: Orthopaedic Surgery | Primary: Geriatric Medicine

## 2022-03-29 ENCOUNTER — Ambulatory Visit: Payer: PRIVATE HEALTH INSURANCE | Attending: Geriatric Medicine | Primary: Geriatric Medicine

## 2022-03-29 NOTE — Progress Notes (Incomplete)
Pap 2019  HPV negative

## 2022-05-25 ENCOUNTER — Ambulatory Visit: Attending: Gastroenterology | Primary: Geriatric Medicine

## 2022-08-07 NOTE — Telephone Encounter (Addendum)
-----   Message from Desma Paganini sent at 07/05/2022 11:05 AM EDT -----  Regarding: colon  Outreach x2 colon    Outreached 1x // Could not get a hold of the patient in regards to a Colonoscopy referral; left voicemail

## 2022-08-09 NOTE — Telephone Encounter (Addendum)
-----   Message from Desma Paganini sent at 07/05/2022 11:05 AM EDT -----  Regarding: colon  Outreach x2 colon    Outreached 2x // Could not get a hold of the patient in regards to a Colonoscopy referral; left voicemail-pt will need a new referral from PCP to r/s

## 2023-03-15 ENCOUNTER — Ambulatory Visit: Admit: 2023-03-15 | Payer: PRIVATE HEALTH INSURANCE | Primary: Geriatric Medicine

## 2023-03-15 DIAGNOSIS — Z0000 Encounter for general adult medical examination without abnormal findings: Secondary | ICD-10-CM

## 2023-03-15 MED ORDER — naproxen (Naprosyn) 375 mg tablet
375 | ORAL_TABLET | Freq: Two times a day (BID) | ORAL | 3 refills | 29.00000 days | Status: AC
Start: 2023-03-15 — End: ?

## 2023-03-15 NOTE — Assessment & Plan Note (Addendum)
Patient present to the office with her daughter who is translating for an annual physical. Ordered CBC, CMP, lipid panel, TSH, A1c for routine monitoring.  Ordered mammogram and colonoscopy for routine monitoring.  Patient was open to getting a Pap smear in the office, had [place] as witness, tolerated well.  Patient received flu shot in the office without immediate complications.  Will follow-up in 1 year.

## 2023-03-15 NOTE — Assessment & Plan Note (Signed)
Stable, no changes made, continue to monitor.  Will refill naproxen as it controls her pain better.  Followed by orthopedics.

## 2023-03-15 NOTE — Assessment & Plan Note (Signed)
Findings likely shoulder tendinitis secondary to overuse.  Discussed findings with patient and recommended conservative treatment plan which included rest, Tylenol/ibuprofen as needed, ice/heat.  Patient understands that if symptoms worsen to RTC or go to nearest ED for further evaluation.  Patient is already established with orthopedics and can address this with them at their next follow-up ointment.  Patient and daughter expressed understanding of plan and agreed to it.

## 2023-03-15 NOTE — Assessment & Plan Note (Signed)
Stable, no changes made, continue to monitor.  Patient would like to reestablish care with her general surgery team, discussed with her that they can reach out to their office to set up a follow-up appointment.  Patient and daughter expressed understanding of plan and agrees to it.

## 2023-03-15 NOTE — Assessment & Plan Note (Addendum)
Patient had a x-ray of the left knee done back in 12/2021 which reported mild degenerative changes.  These findings support that the symptoms are likely due to OA secondary to overuse.  Discussed findings with patient and recommended conservative treatment plan which included rest, Tylenol/ibuprofen as needed, ice/heat.  Patient understands that if symptoms worsen to RTC or go to nearest ED for further evaluation.  Patient is already established with orthopedics and can address this with them at their next follow-up ointment.  Patient and daughter expressed understanding of plan and agreed to it.

## 2023-03-15 NOTE — Progress Notes (Signed)
[facility]  [address]  [place] [address]  Phone: [phone]  Fax: [phone]     Subjective   [name] is a 53 y.o. female who presents for Annual Exam.    #Current Concerns   #left shoulder pain  -Patient presented to the office reporting ongoing left shoulder pain, per patient she had mentioned this in the past and was prescribed naproxen and physical therapy which seem to improve her symptoms, patient reports that primarily is triggered whenever she elevates her shoulder  -Denies N/T of the affected area, sensation changes of the affected area, trauma to the affected area  -Patient reports that she has been using ibuprofen with mild relief of symptoms    #Left knee pain   -Patient presented to the office reporting ongoing left knee pain, patient reports that primarily is triggered whenever she gets out of a chair or stands   -Denies N/T of the affected area, sensation changes of the affected area, trauma to the affected area  -Patient reports that she has been using ibuprofen with mild relief of symptoms    #Follow Up  #Incisional Hernia  -s/p hernia repair 09/09/20  -patient denies abdominal pain, abdominal masses, N/V/D/C   -patient would like to see her surgeon again for follow up, reports that she is nervous because when they had to go back and repair the original hernia again in 2022 she was not having any symptoms, wants to resablich care with them     #OA of right knee   -well managed on naproxen in the past but ran out of it and so she had to switch to other OTC medications which she reports is not as effective   -Denies red ROM, N/T of the affected area, sensation changes, swelling    #screen for breast cancer  -mammogram 11/2019-wnl  -Patient denies breast pain, breast masses, nipple discharge    #screen for colon cancer   -patient was scheduled for a colonoscopy but never showed up to the appt   -Denies melena, blood in her stools, abdominal  pain, unintentional weight loss    #screen for cervical cancer   -PAP smear 08/2017-neg   -Patient reports that she had her last period when she was 51, denies hot flashes or any other menopausal symptoms at this time  -Denies abnormal vaginal bleeding, lower pelvic pain    Diet: reports healthy diet   Exercise: ~2x a week     Smoking: Denies   Alcohol use: Denies   Drug use: Denies     Sexually active?: Denies, denies any symptoms of STDs such as vaginal rash, vaginal abnormal discharge/bleeding, declines STD testing     Occupation: PCA  Living arrangements: her three kids   Safety: Does not own guns or have access to them, wears seatbelt 100% of time     Dental--- yearly, denies any concerns of loose teeth/bleeding gums/cavities  Eyes--- yearly, wears glasses for distance, denies any concerns with vision changes    Patient Active Problem List   Diagnosis    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 All positive responses are listed in  HPI section. All other systems reviewed in detail and are negative.    Social History     Social History Narrative    Not on file     Social History     Tobacco Use    Smoking status: Never    Smokeless tobacco: Never   Vaping Use    Vaping status: Never Used   Substance Use Topics    Alcohol use: Never    Drug use: Never     Current, updated medication list as of end of today's visit:  Current Outpatient Medications   Medication Instructions    cetirizine (ZyrTEC) 10 mg tablet Take 1 pill by mouth daily for active rash. Can increase to two pills daily if helps.    naproxen (NAPROSYN) 375 mg, oral, 2 times daily with meals     Medications discontinued during today's visit:  Medications Discontinued During This Encounter   Medication Reason     naproxen (Naprosyn) 375 mg tablet Reorder       Allergies:  No Known Allergies    History reviewed. No pertinent past medical history.    Past Surgical History:   Procedure Laterality Date    HERNIA REPAIR  2008    Open primary umbilcal hernia repair    HERNIA REPAIR  09/29/2020    INCISIONAL HERNIA REPAIR  2022    Laparoscopic recurrent umbilical hernia repair with mesh       Immunization History   Administered Date(s) Administered    COVID-19 Pfizer Monovalent 07/10/2019, 07/31/2019, 03/11/2020    Influenza, seasonal, injectable, preservative free 03/15/2023    Tdap 11/01/2020   Flu shot-patient willing to get flu shot in clinic today, patient received it on the left deltoid, without any complications    Objective   PE:  BP 127/87 (BP Location: Left arm, Patient Position: Sitting, BP Cuff Size: Adult)   Pulse 73   Temp 36.4 C (97.5 F) (Temporal)   Ht 1.676 m   Wt 85.9 kg   SpO2 99%   BMI 30.58 kg/m   Physical Exam  Constitutional:       General: She is not in acute distress.     Appearance: She is not ill-appearing, toxic-appearing or diaphoretic.   HENT:      Head: Normocephalic.      Right Ear: Tympanic membrane, ear canal and external ear normal. There is no impacted cerumen.      Left Ear: Tympanic membrane, ear canal and external ear normal. There is no impacted cerumen.      Nose: Nose normal. No congestion or rhinorrhea.      Mouth/Throat:      Mouth: Mucous membranes are moist.      Pharynx: Oropharynx is clear. No oropharyngeal exudate or posterior oropharyngeal erythema.   Eyes:      General: No scleral icterus.        Right eye: No discharge.         Left eye: No discharge.      Extraocular Movements: Extraocular movements intact.      Conjunctiva/sclera: Conjunctivae normal.      Pupils: Pupils are equal, round, and reactive to light.   Cardiovascular:      Rate and Rhythm: Normal rate and regular rhythm.      Pulses: Normal pulses.      Heart sounds: Normal heart sounds.   Pulmonary:       Breath sounds: Normal breath sounds.   Abdominal:  General: Bowel sounds are normal. There is no distension.      Palpations: There is no mass.      Tenderness: There is no abdominal tenderness. There is no guarding or rebound.      Hernia: No hernia is present.   Genitourinary:     General: Normal vulva.      Vagina: No vaginal discharge.   Musculoskeletal:      Cervical back: Normal range of motion. No rigidity or tenderness.      Comments: On exam of the left shoulder patient had positive empty can test, reports discomfort with elevation of the left shoulder past 90 degrees.  Patient has full passive/active ROM with 5/5 MSK strength.  Negative for TTP, masses, crepitus noted.    On exam of left knee mild swelling noted comparison to the right.  Mild crepitus noted.  Full passive/active ROM.  Negative for TTP, masses.   Lymphadenopathy:      Cervical: No cervical adenopathy.   Skin:     General: Skin is warm.      Capillary Refill: Capillary refill takes less than 2 seconds.   Neurological:      Mental Status: She is alert and oriented to person, place, and time.      Cranial Nerves: No cranial nerve deficit.   Psychiatric:         Thought Content: Thought content normal.         Judgment: Judgment normal.      Comments: Patient is speaking in full sentences and responding to questions appropriately          Assessment/Plan   [name] is a 53 y.o. female who presents for Annual Exam.    [name] was seen today for annual exam.  Annual physical exam  Assessment & Plan:  Patient present to the office with her daughter who is translating for an annual physical. Ordered CBC, CMP, lipid panel, TSH, A1c for routine monitoring.  Ordered mammogram and colonoscopy for routine monitoring.  Patient was open to getting a Pap smear in the office, had Waller as witness, tolerated well.  Patient received flu shot in the office without immediate complications.  Will follow-up in 1 year.  Orders:  -     CBC and differential;  Future  -     Comprehensive metabolic panel; Future  -     Lipid panel; Future  -     Hemoglobin A1c; Future  -     TSH with reflex; Future  -     BI BILATERAL MAMMOGRAM SCREENING TOMOSYNTHESIS; Future  -     Colonoscopy; Future  -     Flu vaccine greater than or equal to 6 mon old, preservative free IM  -     Pap Smear with HPV DNA  Acute pain of right knee  Comments:  will book f/u with Ortho  she missed her appt due to covid  xray OA/ effusion  Assessment & Plan:  Stable, no changes made, continue to monitor.  Will refill naproxen as it controls her pain better.  Followed by orthopedics.  Orders:  -     naproxen (Naprosyn) 375 mg tablet; Take 1 tablet (375 mg) by mouth with breakfast and with evening meal.  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  Patient would like to reestablish care with her general surgery  team, discussed with her that they can reach out to their office to set up a follow-up appointment.  Patient and daughter expressed understanding of plan and agrees to it.  Acute pain of left shoulder  Assessment & Plan:  Findings likely shoulder tendinitis secondary to overuse.  Discussed findings with patient and recommended conservative treatment plan which included rest, Tylenol/ibuprofen as needed, ice/heat.  Patient understands that if symptoms worsen to RTC or go to nearest ED for further evaluation.  Patient is already established with orthopedics and can address this with them at their next follow-up ointment.  Patient and daughter expressed understanding of plan and agreed to it.   Acute pain of left knee  Assessment & Plan:  Patient had a x-ray of the left knee done back in 12/2021 which reported mild degenerative changes.  These findings support that the symptoms are likely due to OA secondary to overuse.  Discussed findings with patient and recommended  conservative treatment plan which included rest, Tylenol/ibuprofen as needed, ice/heat.  Patient understands that if symptoms worsen to RTC or go to nearest ED for further evaluation.  Patient is already established with orthopedics and can address this with them at their next follow-up ointment.  Patient and daughter expressed understanding of plan and agreed to it.           [name], [place]

## 2023-03-15 NOTE — Other (Signed)
Patient Education  Table of Contents   Influenza (Flu) Vaccine (Inactivated or Recombinant): What You Need to Know    To view videos and all your education online visit,  https://pe.elsevier.com/4nlRlRA3  or scan this QR code with your smartphone.  Access to this content will expire in one year.  Influenza (Flu) Vaccine (Inactivated or Recombinant): What You Need to Know  Many vaccine information statements are available in Spanish and other languages. See PromoAge.com.br.  1. Why get vaccinated?  Influenza vaccine can prevent influenza (flu).  Flu is a contagious disease that spreads around the [place] every year, usually between October and May. Anyone can get the flu, but it is more dangerous for some people. Infants and young children, people 70 years and older, pregnant people, and people with certain health conditions or a weakened immune system are at greatest risk of flu complications.  Pneumonia, bronchitis, sinus infections, and ear infections are examples of flu-related complications. If you have a medical condition, such as heart disease, cancer, or diabetes, flu can make it worse.  Flu can cause fever and chills, sore throat, muscle aches, fatigue, cough, headache, and runny or stuffy nose. Some people may have vomiting and diarrhea, though this is more common in children than adults.  In an average year, thousands of people in the [place] die from flu, and many more are hospitalized. Flu vaccine prevents millions of illnesses and flu-related visits to the doctor each year.  2. Influenza vaccines  CDC recommends everyone 6 months and older get vaccinated every flu season. Children 6 months through 73 years of age may need 2 doses during a single flu season. Everyone else needs only 1 dose each flu season.  It takes about 2 weeks for protection to develop after vaccination.  There are many flu viruses, and they are always changing. Each year a new flu vaccine is made to protect against  the influenza viruses believed to be likely to cause disease in the upcoming flu season. Even when the vaccine doesn't exactly match these viruses, it may still provide some protection.  Influenza vaccine does not cause flu.  Influenza vaccine may be given at the same time as other vaccines.  3. Talk with your health care provider  Tell your vaccination provider if the person getting the vaccine:   Has had an allergic reaction after a previous dose of influenza vaccine, or has any severe, life-threatening allergies   Has ever had Guillain?Barr Syndrome (also called "GBS")  In some cases, your health care provider may decide to postpone influenza vaccination until a future visit.  Influenza vaccine can be administered at any time during pregnancy. People who are or will be pregnant during influenza season should receive inactivated influenza vaccine.  People with minor illnesses, such as a cold, may be vaccinated. People who are moderately or severely ill should usually wait until they recover before getting influenza vaccine.  Your health care provider can give you more information.  4. Risks of a vaccine reaction   Soreness, redness, and swelling where the shot is given, fever, muscle aches, and headache can happen after influenza vaccination.   There may be a very small increased risk of Guillain?Barr Syndrome (GBS) after inactivated influenza vaccine (the flu shot).  Young children who get the flu shot along with pneumococcal vaccine (PCV13) and/or DTaP vaccine at the same time might be slightly more likely to have a seizure caused by fever. Tell your health care provider if a child  who is getting flu vaccine has ever had a seizure.  People sometimes faint after medical procedures, including vaccination. Tell your provider if you feel dizzy or have vision changes or ringing in the ears.  As with any medicine, there is a very remote chance of a vaccine causing a severe allergic reaction, other serious injury, or  death.  5. What if there is a serious problem?  An allergic reaction could occur after the vaccinated person leaves the clinic. If you see signs of a severe allergic reaction (hives, swelling of the face and throat, difficulty breathing, a fast heartbeat, dizziness, or weakness), call 9-1-1 and get the person to the nearest hospital.  For other signs that concern you, call your health care provider.  Adverse reactions should be reported to the Vaccine Adverse Event Reporting System (VAERS). Your health care provider will usually file this report, or you can do it yourself. Visit the VAERS website at www.vaers.LAgents.no or call 319-124-4373. VAERS is only for reporting reactions, and VAERS staff members do not give medical advice.  6. The National Vaccine Injury Compensation Program  The [employer] Vaccine Injury Compensation Program (VICP) is a federal program that was created to compensate people who may have been injured by certain vaccines. Claims regarding alleged injury or death due to vaccination have a time limit for filing, which may be as short as two years. Visit the VICP website at SpiritualWord.at or call (559) 012-5027 to learn about the program and about filing a claim.  7. How can I learn more?   Ask your health care provider.   Call your local or state health department.   Visit the website of the Food and Drug Administration (FDA) for vaccine package inserts and additional information at FinderList.no.   Contact the Centers for Disease Control and Prevention (CDC):  ? Call (313)133-7705 (1-800-CDC-INFO) or  ? Visit CDC's website at BiotechRoom.com.cy.  Source: CDC Vaccine Information Statement Inactivated Influenza Vaccine (10/16/2019)  This same material is available at FootballExhibition.com.br for no charge.  This information is not intended to replace advice given to you by your health care provider. Make sure you discuss any questions you have with your health care  provider.  Document Released: 2005-12-21 Document Updated: 2022-06-13 Document Reviewed: 2022-03-19  Elsevier Patient Education ? 2024 Elsevier Inc.

## 2023-03-19 LAB — PAP SMEAR AND HPV DNA: HPV Aptima: NEGATIVE

## 2023-03-21 NOTE — Telephone Encounter (Signed)
Result Communication  -Discussed Pap smear results which did come back negative, addressed with them that the blood tests any come back yet and they admitted that they did not get the blood work yet and plan to do so next week.  Will follow-up based on results    Resulted Orders   Pap Smear with HPV DNA   Result Value Ref Range    Interpretation NILM       Comment:      NEGATIVE FOR INTRAEPITHELIAL LESION OR MALIGNANCY.    Category: NIL       Comment:      Negative for Intraepithelial Lesion    Adequacy: SDER       Comment:      Satisfactory for evaluation.    Clinician provided ICD10:        Comment:      PBC  Z00.00      Performed by:        Comment:      Carolynn Sayers, Cytotechnologist (ASCP)    Note:        Comment:      The Pap smear is a screening test designed to aid in the detection of  premalignant and malignant conditions of the uterine cervix.  It is not a  diagnostic procedure and should not be used as the sole means of detecting  cervical cancer.  Both false-positive and false-negative reports do occur.      TEST METHODOLOGY:        Comment:      This liquid based ThinPrep(R) pap test was screened with the  use of an image guided system.      HPV Aptima Negative Negative      Comment:      This nucleic acid amplification test detects fourteen high-risk  HPV types (16,18,31,33,35,39,45,51,52,56,58,59,66,68) without  differentiation.      Narrative    Performed at:  [address] [facility]  [address], [place], [place]  [id number]  Lab Director: Greig Right MD, Phone:  [phone]  Performed at:  [address]  [address], [place], [place]  [id number]  Lab Director: Greig Right MD, Phone:  [phone]  Specimen Comment: No. of containers.Marland Kitchen01 ThinPrep Vial   PDF Report:    Narrative    Performed at:  [address]  [address], [place], [place]  [id number]  Lab Director: Greig Right MD, Phone:  [phone]  Specimen Comment: No. of containers.Marland Kitchen01 ThinPrep Vial       5:26 PM      Results were  successfully communicated with the   daughter Eloisa Northern  and they acknowledged their understanding.

## 2023-03-22 NOTE — Telephone Encounter (Signed)
 LMTCB re booking colo screening  (MSG #1 )    Order in [facility] for colo  No prior GI records found

## 2023-03-28 NOTE — Telephone Encounter (Signed)
Swpts daughter to schedule appt for colo     Phone and email confirmed for prep     Procedure: COLONOSCOPY  Indication: SCREENING  Date: 2/26  Time: 9  Doctor: Otilio Saber     Pacemaker/AICD:   NO  Blood thinner med:   NO  Diabetic meds:   NO  GLP1-agonist for diabetes:   OR weight loss:   NOT TAKING   Dialysis:   NO  BMI >45:    NO  IF BMI IS 45 OR ABOVE - MAKE PATIENT AWARE THEY WILL BE SCHEDULED FOR PAT APPOINTMENT  Less than 3 BM/week:  NO  Need for transport:   NO    Pharmacy: [facility] - Location: [place]     -------------  LAST PROCEDURE  Provider: FIRST TIME PROCEDURE

## 2023-03-30 LAB — COMPREHENSIVE METABOLIC PANEL
ALT: 13 IU/L (ref 0–32)
AST: 20 IU/L (ref 0–40)
Albumin: 4.6 g/dL (ref 3.9–4.9)
Alk Phosphatase: 104 IU/L (ref 44–121)
Anion Gap: 12 mmol/L (ref 10.0–18.0)
BUN/Creat Ratio: 23 (ref 9–23)
BUN: 16 mg/dL (ref 6–24)
Bili Total: 0.4 mg/dL (ref 0.0–1.2)
Calcium: 9.6 mg/dL (ref 8.7–10.2)
Carbon Dioxide: 24 mmol/L (ref 20–29)
Chloride: 106 mmol/L (ref 96–106)
Creat: 0.69 mg/dL (ref 0.57–1.00)
Globulin Total: 2.9 g/dL (ref 1.5–4.5)
Glucose: 90 mg/dL (ref 70–99)
Potassium: 4.2 mmol/L (ref 3.5–5.2)
Protein Total: 7.5 g/dL (ref 6.0–8.5)
Sodium: 142 mmol/L (ref 134–144)
eGFR: 106 mL/min/{1.73_m2} (ref >59)

## 2023-03-30 LAB — LIPID PANEL
Cholesterol: 190 mg/dL (ref 100–199)
HDL Cholesterol: 61 mg/dL (ref >39)
LDLc Calc (NIH): 116 mg/dL — ABNORMAL HIGH (ref 0–99)
Non-HDL Chol: 129 mg/dL (ref 0–129)
Triglycerides: 72 mg/dL (ref 0–149)
VLDLc Calc: 13 mg/dL (ref 5–40)

## 2023-03-30 LAB — CBC W/DIFF
Baso Abs: 0 10*3/uL (ref 0.0–0.2)
Basos: 1 %
Eos Abs: 0.1 10*3/uL (ref 0.0–0.4)
Eos: 2 %
Hct: 38.5 % (ref 34.0–46.6)
Hgb: 12.3 g/dL (ref 11.1–15.9)
Immature Grans Abs: 0 10*3/uL (ref 0.0–0.1)
Immature Granulocytes: 0 %
Lymphs Abs: 1.9 10*3/uL (ref 0.7–3.1)
Lymphs: 29 %
MCH: 26.7 pg (ref 26.6–33.0)
MCHC: 31.9 g/dL (ref 31.5–35.7)
MCV: 84 fL (ref 79–97)
Monocytes: 7 %
MonocytesAbs: 0.5 10*3/uL (ref 0.1–0.9)
Neutrophils Abs: 4 10*3/uL (ref 1.4–7.0)
Neutrophils: 61 %
Platelets: 253 10*3/uL (ref 150–450)
RBC: 4.6 x10E6/uL (ref 3.77–5.28)
RDW: 12.6 % (ref 11.7–15.4)
WBC: 6.5 10*3/uL (ref 3.4–10.8)

## 2023-03-30 LAB — TSH WITH REFLEX: TSH: 2.05 u[IU]/mL (ref 0.450–4.500)

## 2023-03-30 LAB — HEMOGLOBIN A1C: HgbA1C: 5.7 % — ABNORMAL HIGH (ref 4.8–5.6)

## 2023-04-04 ENCOUNTER — Inpatient Hospital Stay: Admit: 2023-04-04 | Payer: PRIVATE HEALTH INSURANCE | Primary: Geriatric Medicine

## 2023-04-04 DIAGNOSIS — Z1231 Encounter for screening mammogram for malignant neoplasm of breast: Secondary | ICD-10-CM

## 2023-04-04 NOTE — Telephone Encounter (Signed)
Result Communication  -Called patient's daughter [name] who picked up the phone to discuss her recent lab results, we did discuss that her A1c levels did come back slightly elevated placing her in the prediabetic range and recommended diet and exercise to keep that number down to prevent diabetes down the road  -Patient other labs were otherwise reassuring  -Will follow-up in 1 year for next annual  -Patient's daughter expressed understanding plan agrees to it     Resulted Orders   CBC and differential   Result Value Ref Range    WBC 6.5 3.4 - 10.8 x10E3/uL    RBC 4.60 3.77 - 5.28 x10E6/uL    Hgb 12.3 11.1 - 15.9 g/dL    Hct 84.1 66.0 - 63.0 %    MCV 84 79 - 97 fL    MCH 26.7 26.6 - 33.0 pg    MCHC 31.9 31.5 - 35.7 g/dL    RDW 16.0 10.9 - 32.3 %    Platelets 253 150 - 450 x10E3/uL    Neutrophils 61 Not Estab. %    Lymphs 29 Not Estab. %    Monocytes 7 Not Estab. %    Eos 2 Not Estab. %    Basos 1 Not Estab. %    Neutrophils Abs 4.0 1.4 - 7.0 x10E3/uL    Lymphs Abs 1.9 0.7 - 3.1 x10E3/uL    MonocytesAbs 0.5 0.1 - 0.9 x10E3/uL    Eos Abs 0.1 0.0 - 0.4 x10E3/uL    Baso Abs 0.0 0.0 - 0.2 x10E3/uL    Immature Granulocytes 0 Not Estab. %    Immature Grans Abs 0.0 0.0 - 0.1 x10E3/uL    Narrative    Performed at:  [address]  [address] [place], [place]  [id number]  Lab Director: Greig Right MD, Phone:  [phone]   Comprehensive metabolic panel   Result Value Ref Range    Glucose 90 70 - 99 mg/dL    BUN 16 6 - 24 mg/dL    Creat 8.31 5.17 - 6.16 mg/dL    eGFR 073 >71 GG/YIR/4.85    BUN/Creat Ratio 23 9 - 23    Sodium 142 134 - 144 mmol/L    Potassium 4.2 3.5 - 5.2 mmol/L    Chloride 106 96 - 106 mmol/L    Anion Gap 12.0 10.0 - 18.0 mmol/L    Carbon Dioxide 24 20 - 29 mmol/L    Calcium 9.6 8.7 - 10.2 mg/dL    Protein Total 7.5 6.0 - 8.5 g/dL    Albumin 4.6 3.9 - 4.9 g/dL    Globulin Total 2.9 1.5 - 4.5 g/dL    Bili Total 0.4 0.0 - 1.2 mg/dL    Alk Phosphatase 462 44 - 121 IU/L    AST 20 0 - 40 IU/L    ALT 13 0 - 32  IU/L    Narrative    Performed at:  [address] [facility]  [address], [place], [place]  [id number]  Lab Director: Greig Right MD, Phone:  [phone]   Lipid panel   Result Value Ref Range    Cholesterol 190 100 - 199 mg/dL    Triglycerides 72 0 - 149 mg/dL    HDL Cholesterol 61 >67 mg/dL    VLDLc Calc 13 5 - 40 mg/dL    LDLc Calc (NIH) 893 (H) 0 - 99 mg/dL    Non-HDL Chol 810 0 - 129 mg/dL    Narrative    Performed  at:  [address]  [address], [place], [place]  [id number]  Lab Director: Greig Right MD, Phone:  [phone]   Hemoglobin A1c   Result Value Ref Range    HgbA1C 5.7 (H) 4.8 - 5.6 %      Comment:               Prediabetes: 5.7 - 6.4           Diabetes: >6.4           Glycemic control for adults with diabetes: <7.0      Narrative    Performed at:  [address]  [address] [place], [place]  [id number]  Lab Director: Greig Right MD, Phone:  [phone]   TSH with reflex   Result Value Ref Range    TSH 2.050 0.450 - 4.500 uIU/mL      Comment:      No apparent thyroid disorder. Additional testing not indicated. In  rare instances, Secondary Hypothyroidism as well as Subclinical  Hypothyroidism have been reported in some patients with normal TSH  values.      Narrative    Performed at:  [address] [facility]  [address], [place], [place]  [id number]  Lab Director: Greig Right MD, Phone:  [phone]   Pap Smear with HPV DNA   Result Value Ref Range    Interpretation NILM       Comment:      NEGATIVE FOR INTRAEPITHELIAL LESION OR MALIGNANCY.    Category: NIL       Comment:      Negative for Intraepithelial Lesion    Adequacy: SDER       Comment:      Satisfactory for evaluation.    Clinician provided ICD10:        Comment:      PBC  Z00.00      Performed by:        Comment:      Carolynn Sayers, Cytotechnologist (ASCP)    Note:        Comment:      The Pap smear is a screening test designed to aid in the detection of  premalignant and malignant conditions of the uterine cervix.  It is not a  diagnostic  procedure and should not be used as the sole means of detecting  cervical cancer.  Both false-positive and false-negative reports do occur.      TEST METHODOLOGY:        Comment:      This liquid based ThinPrep(R) pap test was screened with the  use of an image guided system.      HPV Aptima Negative Negative      Comment:      This nucleic acid amplification test detects fourteen high-risk  HPV types (16,18,31,33,35,39,45,51,52,56,58,59,66,68) without  differentiation.      Narrative    Performed at:  [address] [facility]  [address], [place], [place]  [id number]  Lab Director: Greig Right MD, Phone:  [phone]  Performed at:  [address]  [address], [place], [place]  [id number]  Lab Director: Greig Right MD, Phone:  [phone]  Specimen Comment: No. of containers.Marland Kitchen01 ThinPrep Vial   PDF Report:    Narrative    Performed at:  [address]  [address], [place], [place]  [id number]  Lab Director: Greig Right MD, Phone:  [phone]  Specimen Comment: No. of containers.Marland Kitchen01 ThinPrep Vial  5:15 PM      Results were successfully communicated with the  daughter Lamar Benes  and they acknowledged their understanding.

## 2023-04-23 MED ORDER — polyethylene glycol (Golytely) 236-22.74-6.74 -5.86 gram solution
236-22.74-6.74 | ORAL | 0 refills | 28.00000 days | Status: AC
Start: 2023-04-23 — End: ?

## 2023-04-23 NOTE — Telephone Encounter (Signed)
Prep for procedure:  TM ColoPrep: Golytely Split

## 2023-05-08 ENCOUNTER — Ambulatory Visit: Payer: PRIVATE HEALTH INSURANCE | Attending: Gastroenterology | Primary: Geriatric Medicine

## 2023-09-10 NOTE — Telephone Encounter (Signed)
 LMOM for Pt to call back to assess if they are still taking Naprosyn  twic a day?  Niels JONETTA Eve, RN  09/10/2023  8:45 AM

## 2023-09-13 IMAGING — MR CRANIO
11 of 17 series · 20 of 48 positions shown · non-contrast
Comparison: none

[Series 3: T1 · sagittal · 5.0mm · 0.47mm/px · 2 of 20 slices shown (1 of 6)]
[im 1/20]
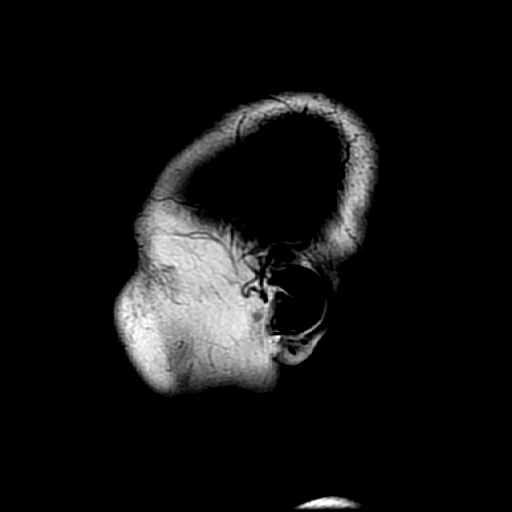
[im 20/20]
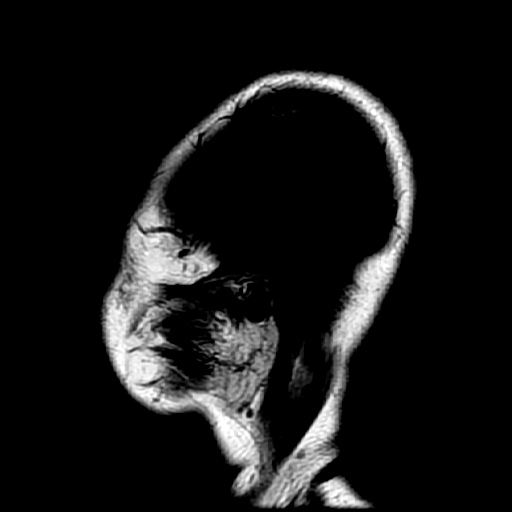

[Series 4: T2 · coronal · 5.0mm · 0.47mm/px · 2 of 24 slices shown (1 of 2)]
[im 1/24]
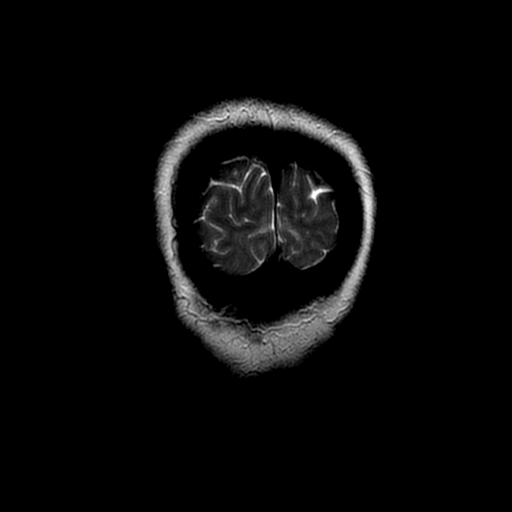
[im 24/24]
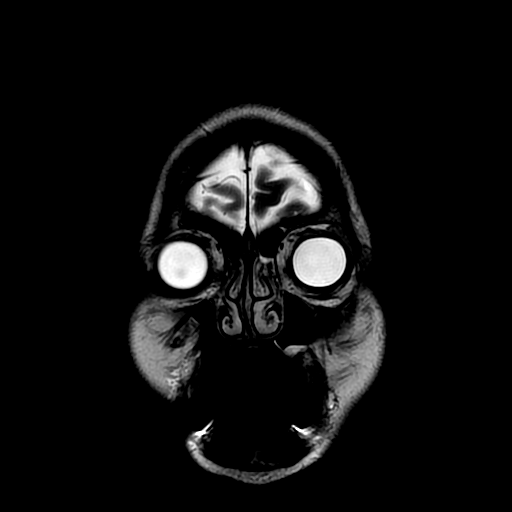

[Series 5: FLAIR · axial · 5.0mm · 0.47mm/px · 1 of 24 slices shown]
[im 1/24]
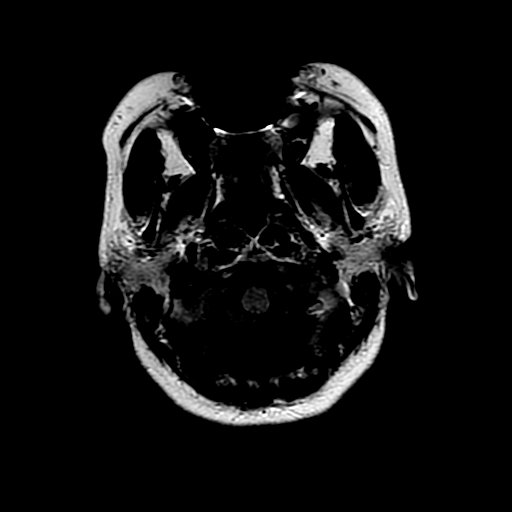

[Series 7: T2 · axial · 5.0mm · 0.47mm/px · 1 of 24 slices shown (2 of 2)]
[im 1/24]
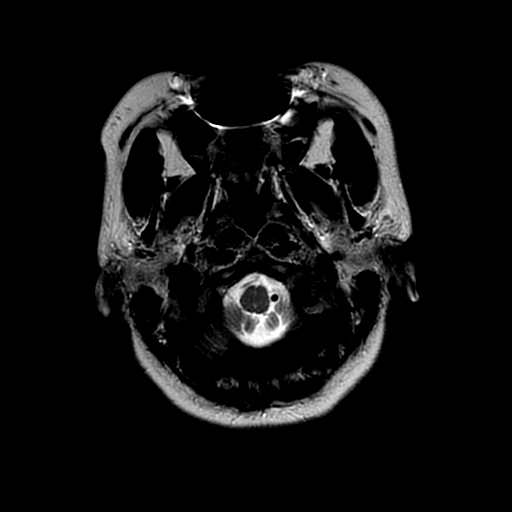

[Series 10: T1 · axial · 5.0mm · 0.47mm/px · 1 of 24 slices shown (2 of 6)]
[im 1/24]
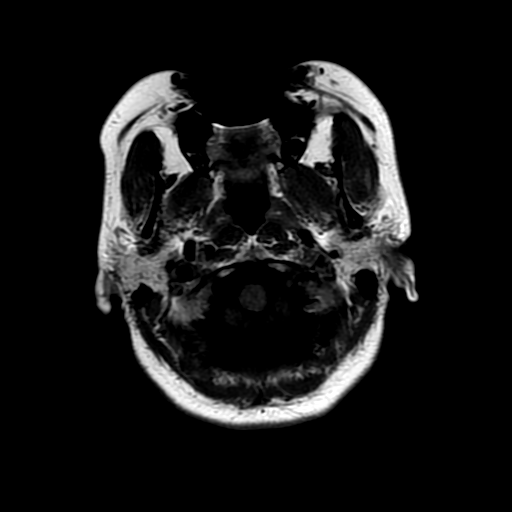

[Series 11: T1 · axial · 5.0mm · 0.47mm/px · 1 of 24 slices shown (3 of 6)]
[im 1/24]
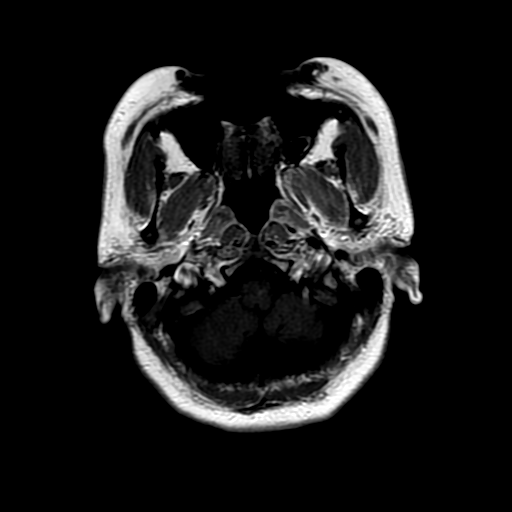

[Series 651: ADC · axial · 5.0mm · 0.94mm/px · 1 of 24 slices shown]
[im 1/24]
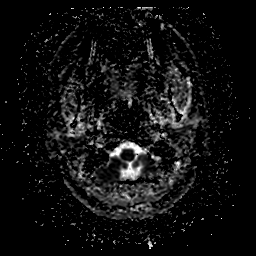

[Series 801: SWI · axial · 1.0mm · 0.47mm/px · 1 of 20 slices shown]
[im 1/20]
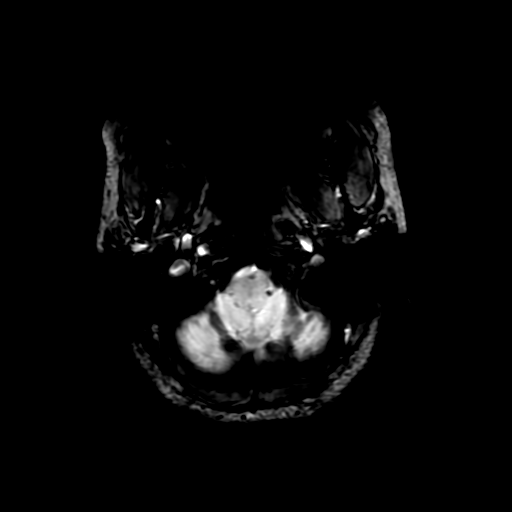

[Series 1200: T1 · axial · 1.2mm · 0.47mm/px · z∈[-39,+123]mm · 2 of 55 slices shown (4 of 6)]
[im 1/55]
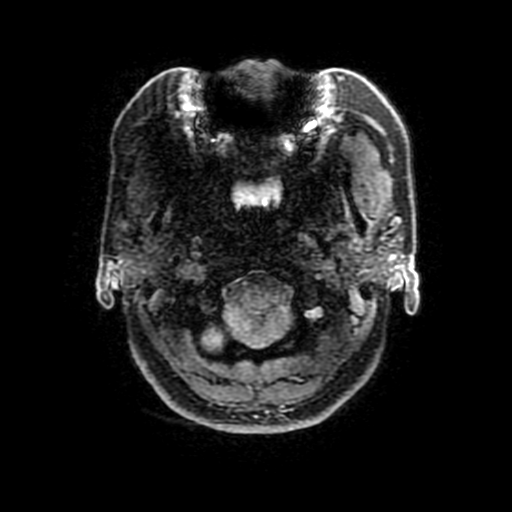
[im 55/55]
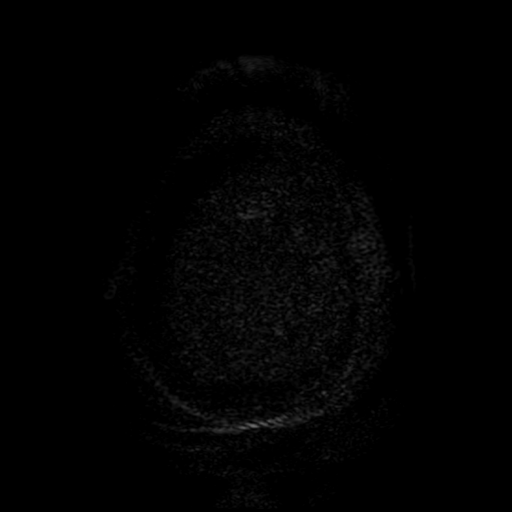

[Series 1201: T1 · sagittal · 0.8mm · 0.47mm/px · 5 of 111 slices shown (5 of 6)]
[im 1/111]
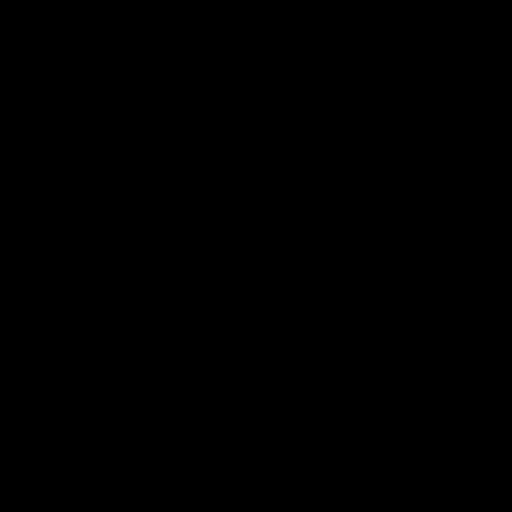
[im 28/111]
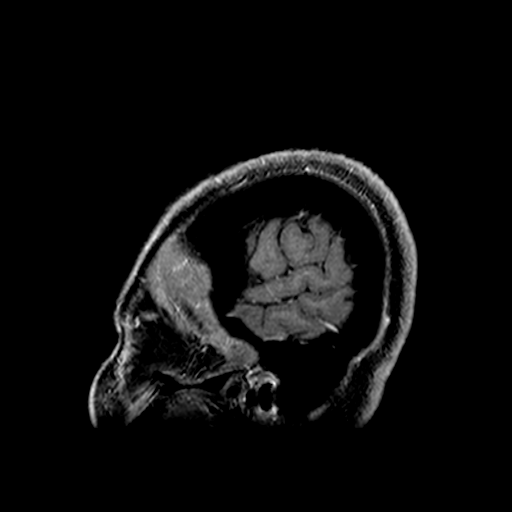
[im 56/111]
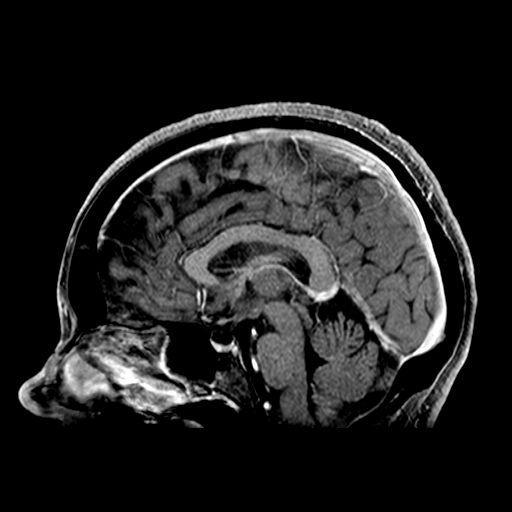
[im 83/111]
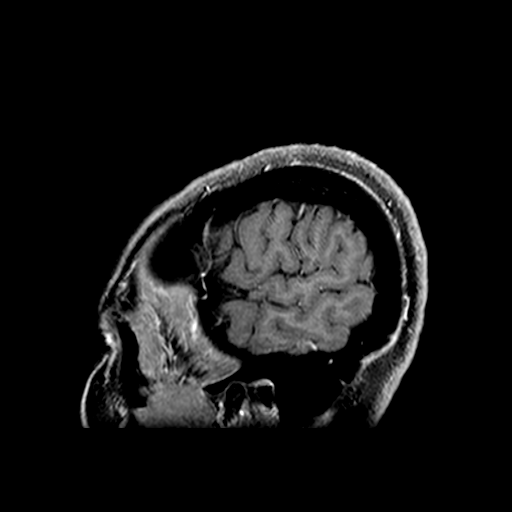
[im 111/111]
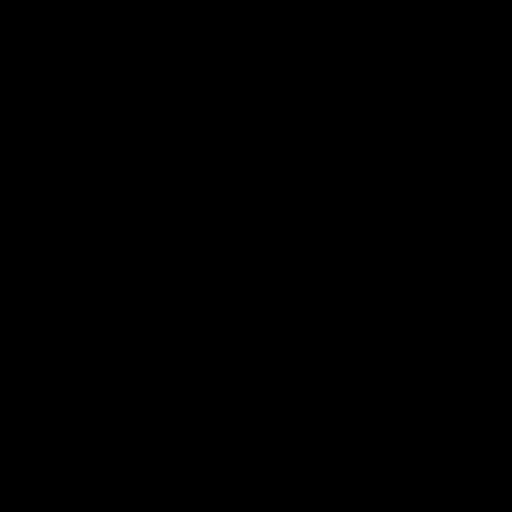

[Series 1202: T1 · coronal · 0.8mm · 0.47mm/px · 3 of 120 slices shown (6 of 6)]
[im 1/120]
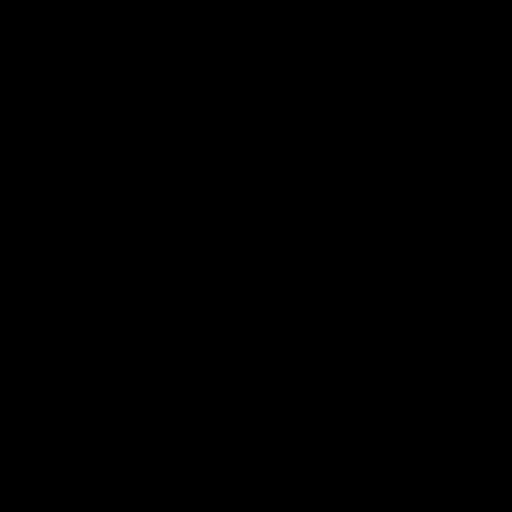
[im 30/120]
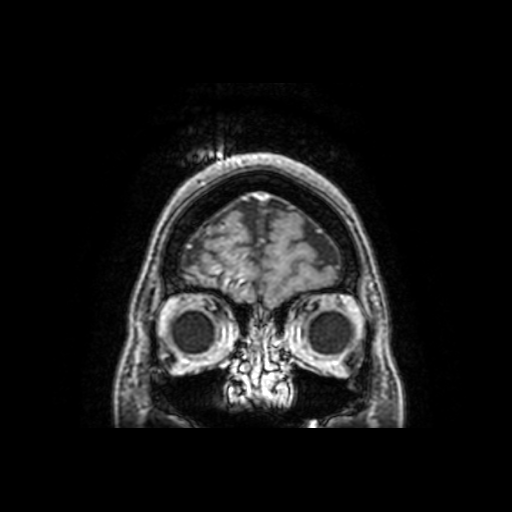
[im 60/120]
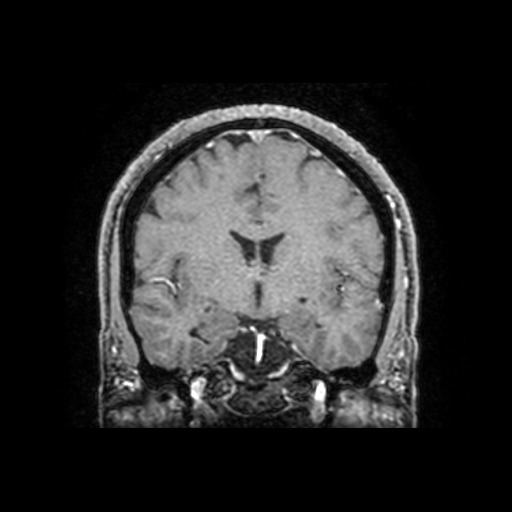

[20 of 48 positions shown; findings below may reference images not displayed]

METODOLOGIA:
Exame realizado com sequências SE (spin-echo), FSE (fast spin-eco), GR (gradiente-eco) e FSE-IR (FLAIR), em planos de
cortes múltiplos, antes e após a administração intravenosa do agente de contraste paramagnético.
ANÁLISE:
Não há evidência de processo expansivo intracraniano, hemorragia intraparenquimatosa aguda, isquemia
aguda/subaguda, bem como de coleções líquidas extra-axiais acima ou abaixo do tentório.
O sistema ventricular é de topograﬁa, morfologia e dimensões normais.
As substâncias branca e cinzenta apresentam intensidade de sinal normais.
RESSONÂNCIA MAGNÉTICA DO ENCÉFALO
Não se evidenciam áreas com impregnação anômala pelo gadolínio.
Não se identiﬁcam áreas com restrição à difusão da água na sequência ECOPLANAR.
Fluxo habitual nas grandes artérias dos sistemas vertebrobasilar e carotídeo, segundo o critério Spin-Echo.
Cavidades aéreas paranasais com intensidade de sinal normal.
IMPRESSÃO:
Avaliação por ressonância magnética do encéfalo dentro dos padrões da normalidade.
Jeziel Vandiver
